# Patient Record
Sex: Female | Born: 1965 | Race: White | Hispanic: No | Marital: Single | Smoking: Current every day smoker
Health system: Southern US, Community
[De-identification: ages and names within clinical notes are randomized; demographics above are authoritative.]

## PROBLEM LIST (undated history)

## (undated) DIAGNOSIS — M549 Dorsalgia, unspecified: Secondary | ICD-10-CM

## (undated) DIAGNOSIS — G8929 Other chronic pain: Secondary | ICD-10-CM

## (undated) DIAGNOSIS — K029 Dental caries, unspecified: Secondary | ICD-10-CM

## (undated) HISTORY — PX: BACK SURGERY: SHX140

## (undated) HISTORY — PX: CHOLECYSTECTOMY: SHX55

---

## 2010-06-07 ENCOUNTER — Emergency Department: Payer: Self-pay | Admitting: Emergency Medicine

## 2012-02-22 ENCOUNTER — Emergency Department: Payer: Self-pay | Admitting: Emergency Medicine

## 2012-03-12 ENCOUNTER — Emergency Department (HOSPITAL_COMMUNITY)
Admission: EM | Admit: 2012-03-12 | Discharge: 2012-03-12 | Disposition: A | Discharge status: Home / Self Care | Payer: Self-pay | Attending: Emergency Medicine | Admitting: Emergency Medicine

## 2012-03-12 ENCOUNTER — Encounter (HOSPITAL_COMMUNITY): Payer: Self-pay | Admitting: Emergency Medicine

## 2012-03-12 DIAGNOSIS — Y998 Other external cause status: Secondary | ICD-10-CM | POA: Insufficient documentation

## 2012-03-12 DIAGNOSIS — K0889 Other specified disorders of teeth and supporting structures: Secondary | ICD-10-CM

## 2012-03-12 DIAGNOSIS — X58XXXA Exposure to other specified factors, initial encounter: Secondary | ICD-10-CM | POA: Insufficient documentation

## 2012-03-12 DIAGNOSIS — F17200 Nicotine dependence, unspecified, uncomplicated: Secondary | ICD-10-CM | POA: Insufficient documentation

## 2012-03-12 DIAGNOSIS — Y9389 Activity, other specified: Secondary | ICD-10-CM | POA: Insufficient documentation

## 2012-03-12 DIAGNOSIS — K029 Dental caries, unspecified: Secondary | ICD-10-CM | POA: Insufficient documentation

## 2012-03-12 DIAGNOSIS — S025XXA Fracture of tooth (traumatic), initial encounter for closed fracture: Secondary | ICD-10-CM | POA: Insufficient documentation

## 2012-03-12 MED ORDER — HYDROCODONE-ACETAMINOPHEN 5-325 MG PO TABS: 1.0000 | ORAL_TABLET | ORAL | Status: AC | PRN

## 2012-03-12 NOTE — ED Notes (Signed)
Patient presents with right upper molar tooth pain x several days; patient reports tooth broke off today while patient was eating lunch.  Patient has several cracked and what appears to be infected teeth throughout mouth.  Patient resting quietly, no distress noted.

## 2012-03-12 NOTE — ED Provider Notes (Signed)
History     CSN: [id number]  Arrival date & time 03/12/12  1124   First MD Initiated Contact with Patient 03/12/12 1253      Chief Complaint  Patient presents with  . Dental Pain    (Consider location/radiation/quality/duration/timing/severity/associated sxs/prior treatment) Patient is a 46 y.o. female presenting with tooth pain. The history is provided by the patient.  Dental PainThe primary symptoms include mouth pain and dental injury (A tooth  broke while she was eating). The symptoms began 2 to 6 hours ago. The symptoms are unchanged. The symptoms occur constantly.  Additional symptoms include: dental sensitivity to temperature and ear pain. Additional symptoms do not include: gum swelling, purulent gums, trismus, jaw pain, trouble swallowing and drooling.   She was seen at a dental to clinic 2 weeks ago, had dental x-rays, and was scheduled to return for dental extractions. She tried to both at home today for the pain, but it did not help.  History reviewed. No pertinent past medical history.  Past Surgical History  Procedure Date  . Cholecystectomy     No family history on file.  History  Substance Use Topics  . Smoking status: Current Everyday Smoker  . Smokeless tobacco: Not on file  . Alcohol Use: Yes    OB History    Grav Para Term Preterm Abortions TAB SAB Ect Mult Living                  Review of Systems  HENT: Positive for ear pain. Negative for drooling and trouble swallowing.   All other systems reviewed and are negative.    Allergies  Review of patient's allergies indicates no known allergies.  Home Medications   Current Outpatient Rx  Name Route Sig Dispense Refill  . IBUPROFEN 200 MG PO TABS Oral Take 200 mg by mouth every 6 (six) hours as needed. For pain    . HYDROCODONE-ACETAMINOPHEN 5-325 MG PO TABS Oral Take 1 tablet by mouth every 4 (four) hours as needed for pain. 10 tablet 0    BP 139/47  Pulse 80  Temp(Src) 98.1 F (36.7 C)  (Oral)  Resp 12  SpO2 99%  Physical Exam  Constitutional: She is oriented to person, place, and time. She appears well-developed and well-nourished. She appears distressed (nild).  HENT:  Head: Normocephalic.       Very poor dentition. Right upper premolar has a large carie and it is fractured.  No dence  fr gum swelling. No trismus.  Eyes: Conjunctivae and EOM are normal. Pupils are equal, round, and reactive to light.  Pulmonary/Chest: Effort normal.  Neurological: She is alert and oriented to person, place, and time. No cranial nerve deficit. She exhibits normal muscle tone. Coordination normal.  Skin: Skin is warm and dry. No rash noted.  Psychiatric: Her behavior is normal. Judgment and thought content normal.    ED Course  Procedures (including critical care time)  Labs Reviewed - No data to display No results found.   1. Pain, dental       MDM  Chronic poor dentition with fracture of a nonsalvageable tooth. No acute infection is suspected. She can be treated for pain and followup with her dental clinic as scheduled  Plan: Home Medications- Norco; Home Treatments- Ibuprofen; Recommended follow up- Dental as scheduled      Flint Melter, MD 03/12/12 1306

## 2012-03-12 NOTE — Discharge Instructions (Signed)
Followup at [facility] dental clinic as soon as possible. Take ibuprofen 3 times a day.   Dental Pain A tooth ache may be caused by cavities (tooth decay). Cavities expose the nerve of the tooth to air and hot or cold temperatures. It may come from an infection or abscess (also called a boil or furuncle) around your tooth. It is also often caused by dental caries (tooth decay). This causes the pain you are having. DIAGNOSIS  Your caregiver can diagnose this problem by exam. TREATMENT   If caused by an infection, it may be treated with medications which kill germs (antibiotics) and pain medications as prescribed by your caregiver. Take medications as directed.   Only take over-the-counter or prescription medicines for pain, discomfort, or fever as directed by your caregiver.   Whether the tooth ache today is caused by infection or dental disease, you should see your dentist as soon as possible for further care.  SEEK MEDICAL CARE IF: The exam and treatment you received today has been provided on an emergency basis only. This is not a substitute for complete medical or dental care. If your problem worsens or new problems (symptoms) appear, and you are unable to meet with your dentist, call or return to this location. SEEK IMMEDIATE MEDICAL CARE IF:   You have a fever.   You develop redness and swelling of your face, jaw, or neck.   You are unable to open your mouth.   You have severe pain uncontrolled by pain medicine.  MAKE SURE YOU:   Understand these instructions.   Will watch your condition.   Will get help right away if you are not doing well or get worse.  Document Released: 12/15/2005 Document Revised: 12/04/2011 Document Reviewed: 08/02/2008 [facility] Patient Information 2012 [place], [place].

## 2012-03-12 NOTE — ED Notes (Signed)
upper rt tooth pain after eating today

## 2012-03-17 ENCOUNTER — Emergency Department: Payer: Self-pay | Admitting: Emergency Medicine

## 2012-03-17 LAB — COMPREHENSIVE METABOLIC PANEL
Albumin: 3.5 g/dL (ref 3.4–5.0)
Alkaline Phosphatase: 128 U/L (ref 50–136)
Anion Gap: 12 (ref 7–16)
Bilirubin,Total: 0.3 mg/dL (ref 0.2–1.0)
Calcium, Total: 8.9 mg/dL (ref 8.5–10.1)
Co2: 26 mmol/L (ref 21–32)
EGFR (Non-African Amer.): >60
Osmolality: 281 (ref 275–301)
Potassium: 4 mmol/L (ref 3.5–5.1)
SGOT(AST): 31 U/L (ref 15–37)
Sodium: 141 mmol/L (ref 136–145)

## 2012-03-17 LAB — CBC
HCT: 39.5 % (ref 35.0–47.0)
HGB: 13.4 g/dL (ref 12.0–16.0)
MCH: 28.2 pg (ref 26.0–34.0)
MCHC: 33.9 g/dL (ref 32.0–36.0)
RBC: 4.75 10*6/uL (ref 3.80–5.20)
WBC: 7.6 10*3/uL (ref 3.6–11.0)

## 2012-03-17 LAB — URINALYSIS, COMPLETE
Ketone: NEGATIVE
Ph: 5 (ref 4.5–8.0)
Protein: 30
RBC,UR: 2972 /HPF (ref 0–5)
Specific Gravity: 1.02 (ref 1.003–1.030)

## 2012-03-17 LAB — LIPASE, BLOOD: Lipase: 69 U/L — ABNORMAL LOW (ref 73–393)

## 2012-03-25 ENCOUNTER — Emergency Department (HOSPITAL_COMMUNITY): Payer: Self-pay

## 2012-03-25 ENCOUNTER — Emergency Department (HOSPITAL_COMMUNITY)
Admission: EM | Admit: 2012-03-25 | Discharge: 2012-03-25 | Disposition: A | Discharge status: Home / Self Care | Payer: Self-pay | Attending: Emergency Medicine | Admitting: Emergency Medicine

## 2012-03-25 ENCOUNTER — Encounter (HOSPITAL_COMMUNITY): Payer: Self-pay | Admitting: *Deleted

## 2012-03-25 DIAGNOSIS — S300XXA Contusion of lower back and pelvis, initial encounter: Secondary | ICD-10-CM | POA: Insufficient documentation

## 2012-03-25 DIAGNOSIS — W108XXA Fall (on) (from) other stairs and steps, initial encounter: Secondary | ICD-10-CM | POA: Insufficient documentation

## 2012-03-25 MED ORDER — OXYCODONE-ACETAMINOPHEN 5-325 MG PO TABS
2.0000 | ORAL_TABLET | Freq: Once | ORAL | Status: AC
  Administered 2012-03-25: 2 via ORAL
  Filled 2012-03-25: qty 2

## 2012-03-25 MED ORDER — OXYCODONE-ACETAMINOPHEN 5-325 MG PO TABS: 1.0000 | ORAL_TABLET | Freq: Four times a day (QID) | ORAL | Status: AC | PRN

## 2012-03-25 NOTE — ED Notes (Signed)
Pt slid down stairs and landed on tailbone.  Pt denies hitting head and denies LOC.  Vital within normal limits and Pt alert oriented X4

## 2012-03-25 NOTE — ED Notes (Signed)
Pt reports she fell down the stairs this am landing on her tailbone. Denies hitting head. Ambulatory without difficulty.

## 2012-03-25 NOTE — Discharge Instructions (Signed)

## 2012-03-25 NOTE — ED Provider Notes (Signed)
History     CSN: [id number]  Arrival date & time 03/25/12  0825   First MD Initiated Contact with Patient 03/25/12 [id number]      Chief Complaint  Patient presents with  . Fall  . Tailbone Pain    (Consider location/radiation/quality/duration/timing/severity/associated sxs/prior treatment) Patient is a 46 y.o. female presenting with fall. The history is provided by the patient.  Fall The accident occurred less than 1 hour ago. The fall occurred while walking (slipped on steps, landed on tailbone.  ). Impact surface: steps. There was no blood loss. Point of impact: buttocks. The pain is severe.    History reviewed. No pertinent past medical history.  Past Surgical History  Procedure Date  . Cholecystectomy   . Back surgery     History reviewed. No pertinent family history.  History  Substance Use Topics  . Smoking status: Current Everyday Smoker  . Smokeless tobacco: Not on file  . Alcohol Use: Yes    OB History    Grav Para Term Preterm Abortions TAB SAB Ect Mult Living                  Review of Systems  All other systems reviewed and are negative.    Allergies  Review of patient's allergies indicates no known allergies.  Home Medications   Current Outpatient Rx  Name Route Sig Dispense Refill  . IBUPROFEN 200 MG PO TABS Oral Take 400-600 mg by mouth every 6 (six) hours as needed. For pain      BP 133/77  Pulse 75  Temp(Src) 98 F (36.7 C) (Oral)  Resp 20  Ht 5\' 4"  (1.626 m)  Wt 210 lb (95.255 kg)  BMI 36.05 kg/m2  SpO2 97%  Physical Exam  Nursing note and vitals reviewed. Constitutional: She is oriented to person, place, and time. She appears well-developed and well-nourished.       Appears very uncomfortable.  Neck: Normal range of motion. Neck supple.  Musculoskeletal:       There is ttp over the coccyx.    Neurological: She is alert and oriented to person, place, and time.       She is ambulatory without difficulty.  No weakness of the  lower extremities.  Skin: Skin is warm and dry.    ED Course  Procedures (including critical care time)  Labs Reviewed - No data to display No results found.   No diagnosis found.    MDM  The xrays look okay.  Patient will be given pain meds, to follow up prn if not improving.          Geoffery Lyons, MD 03/25/12 1000

## 2012-04-04 ENCOUNTER — Emergency Department: Payer: Self-pay | Admitting: Internal Medicine

## 2012-04-07 ENCOUNTER — Emergency Department: Payer: Self-pay | Admitting: Emergency Medicine

## 2012-04-07 LAB — COMPREHENSIVE METABOLIC PANEL
Anion Gap: 9 (ref 7–16)
Creatinine: 0.61 mg/dL (ref 0.60–1.30)
Osmolality: 277 (ref 275–301)
SGOT(AST): 24 U/L (ref 15–37)
SGPT (ALT): 32 U/L
Sodium: 138 mmol/L (ref 136–145)
Total Protein: 8.4 g/dL — ABNORMAL HIGH (ref 6.4–8.2)

## 2012-04-07 LAB — CBC
HCT: 39.9 % (ref 35.0–47.0)
HGB: 13.5 g/dL (ref 12.0–16.0)
MCH: 28 pg (ref 26.0–34.0)
MCHC: 33.8 g/dL (ref 32.0–36.0)
RBC: 4.82 10*6/uL (ref 3.80–5.20)
RDW: 15.4 % — ABNORMAL HIGH (ref 11.5–14.5)

## 2012-04-07 LAB — URINALYSIS, COMPLETE
Bilirubin,UR: NEGATIVE
Blood: NEGATIVE
Ketone: NEGATIVE
Leukocyte Esterase: NEGATIVE
Nitrite: NEGATIVE
Ph: 6 (ref 4.5–8.0)
Protein: NEGATIVE
Squamous Epithelial: 11
WBC UR: 4 /HPF (ref 0–5)

## 2012-04-07 LAB — LIPASE, BLOOD: Lipase: 108 U/L (ref 73–393)

## 2012-05-03 ENCOUNTER — Encounter (HOSPITAL_COMMUNITY): Payer: Self-pay | Admitting: Emergency Medicine

## 2012-05-03 ENCOUNTER — Emergency Department (HOSPITAL_COMMUNITY)
Admission: EM | Admit: 2012-05-03 | Discharge: 2012-05-03 | Disposition: A | Discharge status: Home / Self Care | Payer: Self-pay | Attending: Emergency Medicine | Admitting: Emergency Medicine

## 2012-05-03 DIAGNOSIS — K029 Dental caries, unspecified: Secondary | ICD-10-CM | POA: Insufficient documentation

## 2012-05-03 DIAGNOSIS — K089 Disorder of teeth and supporting structures, unspecified: Secondary | ICD-10-CM | POA: Insufficient documentation

## 2012-05-03 MED ORDER — OXYCODONE-ACETAMINOPHEN 5-325 MG PO TABS: 1.0000 | ORAL_TABLET | Freq: Four times a day (QID) | ORAL | Status: DC | PRN

## 2012-05-03 MED ORDER — PENICILLIN V POTASSIUM 250 MG PO TABS: 250.0000 mg | ORAL_TABLET | Freq: Four times a day (QID) | ORAL | Status: AC

## 2012-05-03 NOTE — ED Notes (Signed)
Pt c/o left bottom tooth pain starting this am; pt sts piece of tooth broke off today; pt sts seen at [facility] and to go back in 2 weeks to dental clinic

## 2012-05-03 NOTE — ED Provider Notes (Addendum)
This chart was scribed for Gwyneth Sprout, MD by Williemae Natter. The patient was seen in room STRE8/STRE8 at 12:39 PM.  History     CSN: [id number]  Arrival date & time 05/03/12  1153   First MD Initiated Contact with Patient 05/03/12 1233      Chief Complaint  Patient presents with  . Dental Pain    (Consider location/radiation/quality/duration/timing/severity/associated sxs/prior treatment) HPI Cathy Thompson is a 46 y.o. female who presents to the Emergency Department complaining of dental pain. Pt reports having hx of poor dentition. Pt has pain in tooth since this morning when a piece of her tooth broke off. Pt was seen at [facility] and is scheduled to go back in 2 weeks. Treating pain with Ibuprofen with no relief History reviewed. No pertinent past medical history.  Past Surgical History  Procedure Date  . Cholecystectomy   . Back surgery     History reviewed. No pertinent family history.  History  Substance Use Topics  . Smoking status: Current Everyday Smoker  . Smokeless tobacco: Not on file  . Alcohol Use: Yes    OB History    Grav Para Term Preterm Abortions TAB SAB Ect Mult Living                  Review of Systems  Constitutional: Negative for fever and chills.  HENT: Positive for dental problem.   Respiratory: Negative for shortness of breath.   Gastrointestinal: Negative for nausea and vomiting.  Neurological: Negative for weakness.  All other systems reviewed and are negative.    Allergies  Review of patient's allergies indicates no known allergies.  Home Medications   Current Outpatient Rx  Name Route Sig Dispense Refill  . IBUPROFEN 200 MG PO TABS Oral Take 400-600 mg by mouth every 6 (six) hours as needed. For pain      BP 160/122  Pulse 90  Temp(Src) 98.4 F (36.9 C) (Oral)  Resp 18  SpO2 98%  Physical Exam  Nursing note and vitals reviewed. Constitutional: She is oriented to person, place, and time. She appears well-developed and  well-nourished. No distress.  HENT:  Head: Normocephalic and atraumatic.  Mouth/Throat: Abnormal dentition. Dental caries present.       Multiple dental caries Decayed and broken off 1st lower molar on left side  Eyes: EOM are normal.  Neck: Neck supple. No tracheal deviation present.  Cardiovascular: Normal rate.   Pulmonary/Chest: Effort normal and breath sounds normal. No respiratory distress.  Musculoskeletal: Normal range of motion.  Neurological: She is alert and oriented to person, place, and time.  Skin: Skin is warm and dry.  Psychiatric: She has a normal mood and affect. Her behavior is normal.    ED Course  Dental Date/Time: 05/03/2012 12:51 PM Performed by: Gwyneth Sprout Authorized by: Gwyneth Sprout Consent: Verbal consent obtained. Consent given by: patient Local anesthesia used: yes Anesthesia: local infiltration (apical dental block) Local anesthetic: bupivacaine 0.5% without epinephrine Anesthetic total: 2 ml Patient sedated: no Patient tolerance: Patient tolerated the procedure well with no immediate complications. Comments: Improvement in pain   (including critical care time)  Labs Reviewed - No data to display No results found.   1. Dental caries       MDM   Pt with multiple dental caries and facial swelling.  No signs of ludwig's angina or difficulty swallowing and no systemic symptoms.  Improvement after dental block Will treat with PCN and have pt f/u with dentist.   I  personally performed the services described in this documentation, which was scribed in my presence.  The recorded information has been reviewed and considered.        Gwyneth Sprout, MD 05/03/12 1252  Gwyneth Sprout, MD 05/03/12 1253

## 2012-05-03 NOTE — Discharge Instructions (Signed)
Dental Pain  A tooth ache may be caused by cavities (tooth decay). Cavities expose the nerve of the tooth to air and hot or cold temperatures. It may come from an infection or abscess (also called a boil or furuncle) around your tooth. It is also often caused by dental caries (tooth decay). This causes the pain you are having.  DIAGNOSIS   Your caregiver can diagnose this problem by exam.  TREATMENT   · If caused by an infection, it may be treated with medications which kill germs (antibiotics) and pain medications as prescribed by your caregiver. Take medications as directed.  · Only take over-the-counter or prescription medicines for pain, discomfort, or fever as directed by your caregiver.  · Whether the tooth ache today is caused by infection or dental disease, you should see your dentist as soon as possible for further care.  SEEK MEDICAL CARE IF:  The exam and treatment you received today has been provided on an emergency basis only. This is not a substitute for complete medical or dental care. If your problem worsens or new problems (symptoms) appear, and you are unable to meet with your dentist, call or return to this location.  SEEK IMMEDIATE MEDICAL CARE IF:   · You have a fever.  · You develop redness and swelling of your face, jaw, or neck.  · You are unable to open your mouth.  · You have severe pain uncontrolled by pain medicine.  MAKE SURE YOU:   · Understand these instructions.  · Will watch your condition.  · Will get help right away if you are not doing well or get worse.  Document Released: 12/15/2005 Document Revised: 12/04/2011 Document Reviewed: 08/02/2008  [employer]® Patient Information ©2012 [employer].

## 2012-05-13 ENCOUNTER — Encounter (HOSPITAL_COMMUNITY): Payer: Self-pay

## 2012-05-13 ENCOUNTER — Emergency Department (HOSPITAL_COMMUNITY)
Admission: EM | Admit: 2012-05-13 | Discharge: 2012-05-13 | Disposition: A | Discharge status: Home / Self Care | Payer: Self-pay | Attending: Emergency Medicine | Admitting: Emergency Medicine

## 2012-05-13 DIAGNOSIS — K0381 Cracked tooth: Secondary | ICD-10-CM | POA: Insufficient documentation

## 2012-05-13 DIAGNOSIS — K0889 Other specified disorders of teeth and supporting structures: Secondary | ICD-10-CM

## 2012-05-13 DIAGNOSIS — K089 Disorder of teeth and supporting structures, unspecified: Secondary | ICD-10-CM | POA: Insufficient documentation

## 2012-05-13 DIAGNOSIS — F17200 Nicotine dependence, unspecified, uncomplicated: Secondary | ICD-10-CM | POA: Insufficient documentation

## 2012-05-13 MED ORDER — OXYCODONE-ACETAMINOPHEN 5-325 MG PO TABS: 2.0000 | ORAL_TABLET | ORAL | Status: DC | PRN

## 2012-05-13 MED ORDER — PENICILLIN V POTASSIUM 500 MG PO TABS: 500.0000 mg | ORAL_TABLET | Freq: Four times a day (QID) | ORAL | Status: AC

## 2012-05-13 NOTE — ED Notes (Signed)
Pt states "i have bad teeth.  My one tooth i had in the back right upper broke off two days ago while i was eating and i've been in soo much pain ever since."  Pt has poor dentition and has obvious carries and discoloration to teeth.  Pt talking very fast; anxious about teeth.  Pt continues to report "i am trying to get into [facility] to get my teeth fixed--i dont have insurance so i cant afford to go to the dentist but they're on a lottery system and they told me to keep trying and eventually they'll be able to fix my teeth."

## 2012-05-13 NOTE — ED Notes (Signed)
Pt presents with R upper tooth pain after it broke eating Romaine lettuce x 2 days ago.

## 2012-05-13 NOTE — ED Provider Notes (Signed)
History   This chart was scribed for Glynn Octave, MD by Charolett Bumpers . The patient was seen in room STRE3/STRE3.    CSN: [id number]  Arrival date & time 05/13/12  1213   First MD Initiated Contact with Patient 05/13/12 1238      Chief Complaint  Patient presents with  . Dental Pain    (Consider location/radiation/quality/duration/timing/severity/associated sxs/prior treatment) HPI Cathy Thompson is a 46 y.o. female who presents to the Emergency Department complaining of constant, moderate right upper dental pain for the past 2 days. Patient denies any fever or vomiting. Patient states that the tooth broke after eating romaine lettuce. Patient states that she has taken Naproxen and Tylenol with no relief. No associated symptoms reported. No pertinent medical or surgical hx reported.   History reviewed. No pertinent past medical history.  Past Surgical History  Procedure Date  . Cholecystectomy   . Back surgery     No family history on file.  History  Substance Use Topics  . Smoking status: Current Everyday Smoker -- 0.5 packs/day  . Smokeless tobacco: Not on file  . Alcohol Use: Yes    OB History    Grav Para Term Preterm Abortions TAB SAB Ect Mult Living                  Review of Systems  Constitutional: Negative for fever.  HENT: Positive for dental problem.   Gastrointestinal: Negative for vomiting.  All other systems reviewed and are negative.    Allergies  Review of patient's allergies indicates no known allergies.  Home Medications   Current Outpatient Rx  Name Route Sig Dispense Refill  . IBUPROFEN 200 MG PO TABS Oral Take 400-600 mg by mouth every 6 (six) hours as needed. For pain    . NAPROXEN SODIUM 220 MG PO TABS Oral Take 220 mg by mouth 2 (two) times daily as needed.      BP 136/89  Pulse 96  Temp(Src) 98.3 F (36.8 C) (Oral)  Resp 20  Ht 5\' 4"  (1.626 m)  Wt 210 lb (95.255 kg)  BMI 36.05 kg/m2  SpO2 97%  LMP  05/13/2012  Physical Exam  Nursing note and vitals reviewed. Constitutional: She is oriented to person, place, and time. She appears well-developed and well-nourished. No distress.  HENT:  Head: Normocephalic and atraumatic. No trismus in the jaw.  Mouth/Throat: No dental abscesses.       Right premolar cracked without evidence of abscess. Floor of mouth soft. No trismus. Poor dentition throughout.   Eyes: EOM are normal.  Neck: Neck supple. No tracheal deviation present.  Cardiovascular: Normal rate.   Pulmonary/Chest: Effort normal. No respiratory distress.  Musculoskeletal: Normal range of motion.  Neurological: She is alert and oriented to person, place, and time.  Skin: Skin is warm and dry.  Psychiatric: She has a normal mood and affect. Her behavior is normal.    ED Course  Procedures (including critical care time)  DIAGNOSTIC STUDIES: Oxygen Saturation is 97% on room air, normal by my interpretation.    COORDINATION OF CARE:  1256: Discussed planned course of treatment with the patient, who is agreeable at this time. Discussed f/u with dentist on call.     Labs Reviewed - No data to display No results found.   No diagnosis found.    MDM  Dental pain without evidence of abscess. Floor of mouth soft. No trismus. No evidence of ludwig's angina Multiple fractured teeth.  Dental followup discussed.  I personally performed the services described in this documentation, which was scribed in my presence.  The recorded information has been reviewed and considered.       Glynn Octave, MD 05/13/12 1308

## 2012-05-13 NOTE — Discharge Instructions (Signed)
Dental Pain  A tooth ache may be caused by cavities (tooth decay). Cavities expose the nerve of the tooth to air and hot or cold temperatures. It may come from an infection or abscess (also called a boil or furuncle) around your tooth. It is also often caused by dental caries (tooth decay). This causes the pain you are having.  DIAGNOSIS   Your caregiver can diagnose this problem by exam.  TREATMENT   · If caused by an infection, it may be treated with medications which kill germs (antibiotics) and pain medications as prescribed by your caregiver. Take medications as directed.  · Only take over-the-counter or prescription medicines for pain, discomfort, or fever as directed by your caregiver.  · Whether the tooth ache today is caused by infection or dental disease, you should see your dentist as soon as possible for further care.  SEEK MEDICAL CARE IF:  The exam and treatment you received today has been provided on an emergency basis only. This is not a substitute for complete medical or dental care. If your problem worsens or new problems (symptoms) appear, and you are unable to meet with your dentist, call or return to this location.  SEEK IMMEDIATE MEDICAL CARE IF:   · You have a fever.  · You develop redness and swelling of your face, jaw, or neck.  · You are unable to open your mouth.  · You have severe pain uncontrolled by pain medicine.  MAKE SURE YOU:   · Understand these instructions.  · Will watch your condition.  · Will get help right away if you are not doing well or get worse.  Document Released: 12/15/2005 Document Revised: 12/04/2011 Document Reviewed: 08/02/2008  [employer]® Patient Information ©2012 [employer].

## 2012-05-19 ENCOUNTER — Encounter (HOSPITAL_COMMUNITY): Payer: Self-pay | Admitting: *Deleted

## 2012-05-19 ENCOUNTER — Emergency Department (HOSPITAL_COMMUNITY)
Admission: EM | Admit: 2012-05-19 | Discharge: 2012-05-19 | Disposition: A | Discharge status: Home / Self Care | Payer: Self-pay | Attending: Emergency Medicine | Admitting: Emergency Medicine

## 2012-05-19 DIAGNOSIS — K0889 Other specified disorders of teeth and supporting structures: Secondary | ICD-10-CM

## 2012-05-19 DIAGNOSIS — K089 Disorder of teeth and supporting structures, unspecified: Secondary | ICD-10-CM | POA: Insufficient documentation

## 2012-05-19 MED ORDER — OXYCODONE-ACETAMINOPHEN 5-325 MG PO TABS
2.0000 | ORAL_TABLET | Freq: Once | ORAL | Status: AC
  Administered 2012-05-19: 2 via ORAL
  Filled 2012-05-19: qty 2

## 2012-05-19 MED ORDER — OXYCODONE-ACETAMINOPHEN 5-325 MG PO TABS: ORAL_TABLET | ORAL | Status: DC

## 2012-05-19 MED ORDER — PENICILLIN V POTASSIUM 500 MG PO TABS: 500.0000 mg | ORAL_TABLET | Freq: Four times a day (QID) | ORAL | Status: AC

## 2012-05-19 NOTE — ED Notes (Signed)
To ED for eval of left lower tooth pain. Pt has been to [facility] dental [facility] without treatment. Unable to go to dentist until insurance starts in 60 days.

## 2012-05-19 NOTE — ED Provider Notes (Addendum)
History    This chart was scribed for Cathy Thompson. Cathy Lamas, MD, MD by Smitty Pluck. The patient was seen in room STRE7 and the patient's care was started at 1:50PM.   CSN: [id number]  Arrival date & time 05/19/12  1328   First MD Initiated Contact with Patient 05/19/12 1346      Chief Complaint  Patient presents with  . Dental Pain    (Consider location/radiation/quality/duration/timing/severity/associated sxs/prior treatment) Patient is a 46 y.o. female presenting with tooth pain. The history is provided by the patient.  Dental PainPrimary symptoms do not include fever, shortness of breath or sore throat.  Additional symptoms do not include: facial swelling and trouble swallowing.   Cathy Thompson is a 46 y.o. female who presents to the Emergency Department complaining of moderate left lower tooth pain onset 2 weeks ago. Symptoms have been constant since onset without radiation.  Pt reports that she has been to [facility] dental [facility] without treatment. Denies fever, chills and vomiting.   History reviewed. No pertinent past medical history.  Past Surgical History  Procedure Date  . Cholecystectomy   . Back surgery     History reviewed. No pertinent family history.  History  Substance Use Topics  . Smoking status: Current Everyday Smoker -- 0.5 packs/day  . Smokeless tobacco: Not on file  . Alcohol Use: Yes    OB History    Grav Para Term Preterm Abortions TAB SAB Ect Mult Living                  Review of Systems  Constitutional: Negative for fever and chills.  HENT: Positive for dental problem. Negative for sore throat, facial swelling and trouble swallowing.   Respiratory: Negative for shortness of breath.   Gastrointestinal: Negative for nausea and vomiting.    Allergies  Review of patient's allergies indicates no known allergies.  Home Medications   Current Outpatient Rx  Name Route Sig Dispense Refill  . IBUPROFEN 200 MG PO TABS Oral Take 400-600 mg by mouth  every 6 (six) hours as needed. For pain    . NAPROXEN SODIUM 220 MG PO TABS Oral Take 220 mg by mouth 2 (two) times daily as needed. For pain    . PENICILLIN V POTASSIUM 500 MG PO TABS Oral Take 1 tablet (500 mg total) by mouth 4 (four) times daily. 40 tablet 0  . OXYCODONE-ACETAMINOPHEN 5-325 MG PO TABS  1-2 tablets po q 6 hours prn severe pain 20 tablet 0  . PENICILLIN V POTASSIUM 500 MG PO TABS Oral Take 1 tablet (500 mg total) by mouth 4 (four) times daily. 40 tablet 0    BP 157/118  Pulse 98  Temp(Src) 97.9 F (36.6 C) (Oral)  Resp 18  SpO2 95%  LMP 05/13/2012  Physical Exam  Nursing note and vitals reviewed. Constitutional: She is oriented to person, place, and time. She appears well-developed and well-nourished. No distress.  HENT:  Head: Normocephalic and atraumatic.  Mouth/Throat: Uvula is midline and mucous membranes are normal.       General decay started in 1st premolar and continuing throughout molars with second molar is worst on bottom left.  Third molar is not present. No fluctuant abscess    Eyes: Conjunctivae are normal. Pupils are equal, round, and reactive to light.  Neck: Normal range of motion. Neck supple.  Pulmonary/Chest: Effort normal. No respiratory distress.  Abdominal: Soft. She exhibits no distension.  Neurological: She is alert and oriented to person, place, and  time.  Skin: Skin is warm and dry.  Psychiatric: She has a normal mood and affect. Her behavior is normal.    ED Course  Procedures (including critical care time) DIAGNOSTIC STUDIES: Oxygen Saturation is 95% on room air, normal by my interpretation.    COORDINATION OF CARE: 1:54PM EDP discusses pt ED treatment with pt   Labs Reviewed - No data to display No results found.   1. Pain, dental       MDM  I personally performed the services described in this documentation, which was scribed in my presence. The recorded information has been reviewed and considered.    Pt with  dentalgia, possibly early abscess, pt has no insurance, referred to Dr. Oswaldo Done and told to inform staff that she is an ED referral.  She will do so shortly.  Rx for PCN and 20 percocet given.          Cathy Thompson. Cathy Lamas, MD 05/19/12 1415  Cathy Thompson. Lindy Pennisi, MD 05/19/12 1416

## 2012-05-19 NOTE — Discharge Instructions (Signed)
Dental Pain A tooth ache may be caused by cavities (tooth decay). Cavities expose the nerve of the tooth to air and hot or cold temperatures. It may come from an infection or abscess (also called a boil or furuncle) around your tooth. It is also often caused by dental caries (tooth decay). This causes the pain you are having. DIAGNOSIS  Your caregiver can diagnose this problem by exam. TREATMENT   If caused by an infection, it may be treated with medications which kill germs (antibiotics) and pain medications as prescribed by your caregiver. Take medications as directed.   Only take over-the-counter or prescription medicines for pain, discomfort, or fever as directed by your caregiver.   Whether the tooth ache today is caused by infection or dental disease, you should see your dentist as soon as possible for further care.  SEEK MEDICAL CARE IF: The exam and treatment you received today has been provided on an emergency basis only. This is not a substitute for complete medical or dental care. If your problem worsens or new problems (symptoms) appear, and you are unable to meet with your dentist, call or return to this location. SEEK IMMEDIATE MEDICAL CARE IF:   You have a fever.   You develop redness and swelling of your face, jaw, or neck.   You are unable to open your mouth.   You have severe pain uncontrolled by pain medicine.  MAKE SURE YOU:   Understand these instructions.   Will watch your condition.   Will get help right away if you are not doing well or get worse.  Document Released: 12/15/2005 Document Revised: 12/04/2011 Document Reviewed: 08/02/2008 [employer] Patient Information 2012 [employer].    Narcotic and benzodiazepine use may cause drowsiness, slowed breathing or dependence.  Please use with caution and do not drive, operate machinery or watch young children alone while taking them.  Taking combinations of these medications or drinking alcohol will potentiate  these effects.    

## 2012-06-03 ENCOUNTER — Emergency Department (HOSPITAL_COMMUNITY)
Admission: EM | Admit: 2012-06-03 | Discharge: 2012-06-03 | Disposition: A | Discharge status: Home / Self Care | Payer: Self-pay | Attending: Emergency Medicine | Admitting: Emergency Medicine

## 2012-06-03 ENCOUNTER — Encounter (HOSPITAL_COMMUNITY): Payer: Self-pay | Admitting: *Deleted

## 2012-06-03 DIAGNOSIS — X58XXXA Exposure to other specified factors, initial encounter: Secondary | ICD-10-CM | POA: Insufficient documentation

## 2012-06-03 DIAGNOSIS — S025XXA Fracture of tooth (traumatic), initial encounter for closed fracture: Secondary | ICD-10-CM | POA: Insufficient documentation

## 2012-06-03 DIAGNOSIS — K029 Dental caries, unspecified: Secondary | ICD-10-CM

## 2012-06-03 DIAGNOSIS — F17200 Nicotine dependence, unspecified, uncomplicated: Secondary | ICD-10-CM | POA: Insufficient documentation

## 2012-06-03 MED ORDER — TRAMADOL HCL 50 MG PO TABS
50.0000 mg | ORAL_TABLET | Freq: Once | ORAL | Status: AC
  Administered 2012-06-03: 50 mg via ORAL
  Filled 2012-06-03: qty 1

## 2012-06-03 MED ORDER — TRAMADOL HCL 50 MG PO TABS: 50.0000 mg | ORAL_TABLET | Freq: Four times a day (QID) | ORAL | Status: AC | PRN

## 2012-06-03 NOTE — ED Provider Notes (Signed)
History     CSN: [id number]  Arrival date & time 06/03/12  1045   None     Chief Complaint  Patient presents with  . Dental Pain    (Consider location/radiation/quality/duration/timing/severity/associated sxs/prior treatment) Patient is a 46 y.o. female presenting with tooth pain. The history is provided by the patient.  Dental PainThe primary symptoms include mouth pain. Primary symptoms do not include oral lesions, fever or sore throat. The symptoms began more than 1 month ago. The symptoms are worsening. The symptoms occur constantly.  Additional symptoms do not include: purulent gums and facial swelling. Associated symptoms comments: 46 yo female INAD c/o lower left tooth pain exacerbation. Denies fever and chills or swelling to gum. Pt recently completed PCN course and has an appointment for tooth extraction in 3 weeks .    History reviewed. No pertinent past medical history.  Past Surgical History  Procedure Date  . Cholecystectomy   . Back surgery     No family history on file.  History  Substance Use Topics  . Smoking status: Current Everyday Smoker -- 0.5 packs/day  . Smokeless tobacco: Not on file  . Alcohol Use: No    OB History    Grav Para Term Preterm Abortions TAB SAB Ect Mult Living                  Review of Systems  Constitutional: Negative.  Negative for fever.  HENT: Positive for dental problem. Negative for sore throat, facial swelling, neck pain and neck stiffness.   Respiratory: Negative.   Cardiovascular: Negative.   Gastrointestinal: Negative.     Allergies  Review of patient's allergies indicates no known allergies.  Home Medications   Current Outpatient Rx  Name Route Sig Dispense Refill  . IBUPROFEN 200 MG PO TABS Oral Take 800 mg by mouth every 6 (six) hours as needed. For pain    . NAPROXEN SODIUM 220 MG PO TABS Oral Take 220 mg by mouth 2 (two) times daily as needed. For pain      BP 161/94  Pulse 84  Temp(Src) 97.7 F  (36.5 C) (Oral)  Resp 18  SpO2 95%  LMP 05/13/2012  Physical Exam  Constitutional: She appears well-developed and well-nourished. No distress.  HENT:  Head: Normocephalic.       Multiple dental and tooth fractures carries without abscess.  Eyes: Pupils are equal, round, and reactive to light.  Neck: Normal range of motion.    ED Course  Procedures (including critical care time)  Labs Reviewed - No data to display No results found.   No diagnosis found. Dental Caries    MDM  Pt with chronic dental issues. Denies fever and swelling. No evidence of abscess. Gave Rx for tramadol and instructed her to follow with dentist for definitive care         Rodena Medin, PA-C 06/03/12 1118

## 2012-06-03 NOTE — ED Notes (Signed)
Pt presents with multiple dental caries "for a while" to L lower side.  Pt reports she has been seen at [facility] clinic for same, reports she goes back in 3 weeks, reports pain has worsened.  Slight swelling noted to L jaw.

## 2012-06-03 NOTE — ED Notes (Signed)
Patient with complaints of tooth pain on the lower left side for 2 days.  She is to see the dental clinic in 3 weeks

## 2012-06-03 NOTE — Discharge Instructions (Signed)
Follow with your dentist  And continue to take motrin as needed. Take tramadol for breakthrough pain. Return for Fever or Swelling   Tooth Decay  Tooth decay happens when a tooth breaks down. This break down can cause a hole in the tooth that can get bigger and deeper over time. Treatment is needed right away. HOME CARE To prevent tooth decay:  Brush your teeth and floss every day.   Make and keep all regular check-ups and cleanings with your dentist.  GET HELP RIGHT AWAY IF:   You have a fever over 102 F (38.9 C).   You see redness and puffiness (swelling) of the face, jaw, or neck.   You cannot open your mouth.   You have severe tooth pain not helped by medicine.  Document Released: 09/23/2008 Document Revised: 12/04/2011 Document Reviewed: 09/23/2008 [facility] Patient Information 2012 [place], [place].

## 2012-06-03 NOTE — ED Provider Notes (Signed)
Medical screening examination/treatment/procedure(s) were performed by non-physician practitioner and as supervising physician I was immediately available for consultation/collaboration.   Itzae Mccurdy, MD 06/03/12 1614 

## 2012-06-15 ENCOUNTER — Emergency Department: Payer: Self-pay | Admitting: Internal Medicine

## 2012-07-02 ENCOUNTER — Encounter (HOSPITAL_COMMUNITY): Payer: Self-pay | Admitting: Physical Medicine and Rehabilitation

## 2012-07-02 ENCOUNTER — Emergency Department (HOSPITAL_COMMUNITY)
Admission: EM | Admit: 2012-07-02 | Discharge: 2012-07-02 | Disposition: A | Discharge status: Home / Self Care | Payer: Self-pay | Attending: Emergency Medicine | Admitting: Emergency Medicine

## 2012-07-02 DIAGNOSIS — Z9089 Acquired absence of other organs: Secondary | ICD-10-CM | POA: Insufficient documentation

## 2012-07-02 DIAGNOSIS — K029 Dental caries, unspecified: Secondary | ICD-10-CM | POA: Insufficient documentation

## 2012-07-02 DIAGNOSIS — G8929 Other chronic pain: Secondary | ICD-10-CM

## 2012-07-02 DIAGNOSIS — F17200 Nicotine dependence, unspecified, uncomplicated: Secondary | ICD-10-CM | POA: Insufficient documentation

## 2012-07-02 MED ORDER — PENICILLIN V POTASSIUM 500 MG PO TABS: 500.0000 mg | ORAL_TABLET | Freq: Four times a day (QID) | ORAL | Status: AC

## 2012-07-02 MED ORDER — OXYCODONE-ACETAMINOPHEN 5-325 MG PO TABS: 1.0000 | ORAL_TABLET | Freq: Four times a day (QID) | ORAL | Status: AC | PRN

## 2012-07-02 NOTE — ED Provider Notes (Signed)
History   This chart was scribed for Kamel Haven B. Bernette Mayers, MD by Toya Smothers. The patient was seen in room TR05C/TR05C. Patient's care was started at 1105.  CSN: [id number]  Arrival date & time 07/02/12  1105   First MD Initiated Contact with Patient 07/02/12 1214      Chief Complaint  Patient presents with  . Dental Pain   HPI  Cathy Thompson is a 46 y.o. female who presents to the Emergency Department complaining of intermittent moderate dental pain yesterday. Similar to numerous previous episodes. Pt has treated with tramadol, aleve, and topical treatment with mild to no relief. Pt is awaiting an appointment at [facility]. Pt lists a h/o cholecystectomy and back surgery.  No past medical history on file.  Past Surgical History  Procedure Date  . Cholecystectomy   . Back surgery     No family history on file.  History  Substance Use Topics  . Smoking status: Current Everyday Smoker -- 0.5 packs/day    Types: Cigarettes  . Smokeless tobacco: Not on file  . Alcohol Use: No   Review of Systems All other systems reviewed and are negative except as noted in HPI.   Allergies  Review of patient's allergies indicates no known allergies.  Home Medications   Current Outpatient Rx  Name Route Sig Dispense Refill  . NAPROXEN SODIUM 220 MG PO TABS Oral Take 220 mg by mouth 2 (two) times daily as needed. For pain      BP 155/76  Pulse 75  Temp 98.3 F (36.8 C) (Oral)  Resp 20  SpO2 98%  Physical Exam  Nursing note and vitals reviewed. Constitutional: She is oriented to person, place, and time. She appears well-developed and well-nourished.  HENT:  Head: Normocephalic and atraumatic.       Poor dentition, multiple large dental caries and decayed teeth, no abscess  Eyes: EOM are normal. Pupils are equal, round, and reactive to light.  Neck: Normal range of motion. Neck supple.  Cardiovascular: Normal rate, normal heart sounds and intact distal pulses.     Pulmonary/Chest: Effort normal and breath sounds normal.  Abdominal: Bowel sounds are normal. She exhibits no distension. There is no tenderness.  Musculoskeletal: Normal range of motion. She exhibits no edema and no tenderness.  Neurological: She is alert and oriented to person, place, and time. She has normal strength. No cranial nerve deficit or sensory deficit.  Skin: Skin is warm and dry. No rash noted.  Psychiatric: She has a normal mood and affect.    ED Course  Procedures (including critical care time) DIAGNOSTIC STUDIES: Oxygen Saturation is 98% on room air, normal by my interpretation.    COORDINATION OF CARE: 1222-Evaluated Pt. Discussed possible referrals for extraction.   Labs Reviewed - No data to display No results found.   No diagnosis found.    MDM  Dental pain due to dental caries. Advised followup with Dentist.      I personally performed the services described in the documentation, which were scribed in my presence. The recorded information has been reviewed and considered.     Krystle Polcyn B. Bernette Mayers, MD 07/02/12 [phone]

## 2012-07-02 NOTE — ED Notes (Signed)
Pt presents to department for evaluation of L lower molar pain. Ongoing x1 day. States issues with same tooth before, does not have dentist. 10/10 pain upon arrival. No facial swelling noted. Respirations unlabored. No signs of distress noted.

## 2012-08-20 ENCOUNTER — Emergency Department: Payer: Self-pay | Admitting: Emergency Medicine

## 2012-09-16 ENCOUNTER — Emergency Department (HOSPITAL_COMMUNITY)
Admission: EM | Admit: 2012-09-16 | Discharge: 2012-09-16 | Disposition: A | Discharge status: Home / Self Care | Payer: Self-pay | Attending: Emergency Medicine | Admitting: Emergency Medicine

## 2012-09-16 ENCOUNTER — Encounter (HOSPITAL_COMMUNITY): Payer: Self-pay | Admitting: Emergency Medicine

## 2012-09-16 DIAGNOSIS — F17200 Nicotine dependence, unspecified, uncomplicated: Secondary | ICD-10-CM | POA: Insufficient documentation

## 2012-09-16 DIAGNOSIS — X58XXXA Exposure to other specified factors, initial encounter: Secondary | ICD-10-CM | POA: Insufficient documentation

## 2012-09-16 DIAGNOSIS — K0889 Other specified disorders of teeth and supporting structures: Secondary | ICD-10-CM

## 2012-09-16 DIAGNOSIS — S025XXA Fracture of tooth (traumatic), initial encounter for closed fracture: Secondary | ICD-10-CM | POA: Insufficient documentation

## 2012-09-16 MED ORDER — OXYCODONE-ACETAMINOPHEN 5-325 MG PO TABS: 1.0000 | ORAL_TABLET | Freq: Four times a day (QID) | ORAL | Status: DC | PRN

## 2012-09-16 NOTE — ED Provider Notes (Signed)
History  Scribed for [employer]. Rubin Payor, MD, the patient was seen in room TR02C/TR02C. This chart was scribed by Candelaria Stagers. The patient's care started at 1:05 PM   CSN: [id number]  Arrival date & time 09/16/12  1232   None     Chief Complaint  Patient presents with  . Dental Pain    The history is provided by the patient. No language interpreter was used.   Cathy Thompson is a 46 y.o. female who presents to the Emergency Department complaining of right lower dental pain that became worse yesterday.  She is a pt at [employer] [facility] school and was not able to be seen yesterday for the pain.  She has used oragel and has taken antibiotics since the pain started.  She denies fever.    History reviewed. No pertinent past medical history.  Past Surgical History  Procedure Date  . Cholecystectomy   . Back surgery     No family history on file.  History  Substance Use Topics  . Smoking status: Current Every Day Smoker -- 0.5 packs/day    Types: Cigarettes  . Smokeless tobacco: Not on file  . Alcohol Use: No    OB History    Grav Para Term Preterm Abortions TAB SAB Ect Mult Living                  Review of Systems  Constitutional: Negative for fatigue.  HENT: Positive for dental problem.   All other systems reviewed and are negative.    Allergies  Review of patient's allergies indicates no known allergies.  Home Medications   Current Outpatient Rx  Name Route Sig Dispense Refill  . IBUPROFEN 200 MG PO TABS Oral Take 600 mg by mouth every 6 (six) hours as needed. For pain    . PENICILLIN V POTASSIUM 250 MG PO TABS Oral Take 250 mg by mouth 2 (two) times daily.    . OXYCODONE-ACETAMINOPHEN 5-325 MG PO TABS Oral Take 1-2 tablets by mouth every 6 (six) hours as needed for pain. 10 tablet 0    BP 156/75  Pulse 94  Temp 98.4 F (36.9 C) (Oral)  Resp 22  SpO2 99%  Physical Exam  Nursing note and vitals reviewed. Constitutional: She is oriented to person,  place, and time. She appears well-developed and well-nourished. No distress.  HENT:  Head: Normocephalic and atraumatic.       Right upper fourth tooth is broken off from the miline.  No fluctuant, no erythema.  Multiple previously extracted teeth.  On the left upper side there is a broken off tooth that is non tender with no erythema.   Eyes: EOM are normal. Pupils are equal, round, and reactive to light.  Neck: Normal range of motion.  Pulmonary/Chest: Effort normal. No respiratory distress.  Musculoskeletal: Normal range of motion. She exhibits no edema.  Neurological: She is alert and oriented to person, place, and time. No sensory deficit.  Skin: Skin is warm and dry.  Psychiatric: She has a normal mood and affect. Her behavior is normal.    ED Course  Procedures   DIAGNOSTIC STUDIES:   COORDINATION OF CARE:     Labs Reviewed - No data to display No results found.   1. Pain, dental       MDM  Patient with dental pain broken off tooth. She did seen at the dental school at [facility]. She states she was there yesterday was not seen. No apparent abscess. She  is already on penicillin that she had at home. She be given pain medicines and will followup with the clinic. No clear abscess. I personally performed the services described in this documentation, which was scribed in my presence. The recorded information has been reviewed and considered.          Juliet Rude. Rubin Payor, MD 09/16/12 1306

## 2012-09-16 NOTE — ED Notes (Signed)
Is having her teeth worked on and taken out and she did not get called this wed now having pain

## 2012-09-16 NOTE — ED Notes (Signed)
Pt reports dental pain after dental work.  Pt alert oriented X4

## 2012-10-08 ENCOUNTER — Encounter (HOSPITAL_COMMUNITY): Payer: Self-pay | Admitting: Nurse Practitioner

## 2012-10-08 ENCOUNTER — Emergency Department (HOSPITAL_COMMUNITY)
Admission: EM | Admit: 2012-10-08 | Discharge: 2012-10-08 | Disposition: A | Discharge status: Home / Self Care | Payer: Self-pay | Attending: Emergency Medicine | Admitting: Emergency Medicine

## 2012-10-08 DIAGNOSIS — K0889 Other specified disorders of teeth and supporting structures: Secondary | ICD-10-CM

## 2012-10-08 DIAGNOSIS — K089 Disorder of teeth and supporting structures, unspecified: Secondary | ICD-10-CM | POA: Insufficient documentation

## 2012-10-08 DIAGNOSIS — F17200 Nicotine dependence, unspecified, uncomplicated: Secondary | ICD-10-CM | POA: Insufficient documentation

## 2012-10-08 MED ORDER — PENICILLIN V POTASSIUM 500 MG PO TABS: 500.0000 mg | ORAL_TABLET | Freq: Four times a day (QID) | ORAL | Status: AC

## 2012-10-08 NOTE — ED Notes (Signed)
Pt states she had 3 upper back R teeth extracted at [facility] Wednesday. Since the extraction, increasing pain and swelling. States she looked at incisions and "they look bad."

## 2012-10-08 NOTE — ED Notes (Signed)
Pt left without discharge papers. edp aware

## 2012-10-08 NOTE — ED Provider Notes (Signed)
History   This chart was scribed for No att. providers found by Toya Smothers. The patient was seen in room TR06C/TR06C. Patient's care was started at 1348.  CSN: [id number]  Arrival date & time 10/08/12  1348   First MD Initiated Contact with Patient 10/08/12 1559      Chief Complaint  Patient presents with  . Dental Pain   The history is provided by the patient. No language interpreter was used.    Cathy Thompson is a 46 y.o. female who presents to the Emergency Department complaining of 1 day new worsening gradual onset right maxillary dental pain after extraction earlier this week. Associated symptoms inculde mild facial swelling and redness around the area of extraction. Pt reports that she has had multiple extraction on the lower and upper jaw within the past 2 weeks, though the pain and swelling is unlike any before. PTA Pt has not treated symptoms. Pt denies fever, chills, emesis, nausea, rash, and cough.   History reviewed. No pertinent past medical history.  Past Surgical History  Procedure Date  . Cholecystectomy   . Back surgery     History reviewed. No pertinent family history.  History  Substance Use Topics  . Smoking status: Current Every Day Smoker -- 0.5 packs/day    Types: Cigarettes  . Smokeless tobacco: Not on file  . Alcohol Use: No   Review of Systems  Unable to perform ROS HENT: Positive for facial swelling and dental problem.   All other systems reviewed and are negative.    Allergies  Review of patient's allergies indicates no known allergies.  Home Medications   Current Outpatient Rx  Name Route Sig Dispense Refill  . IBUPROFEN 200 MG PO TABS Oral Take 600 mg by mouth every 6 (six) hours as needed. For pain    . PENICILLIN V POTASSIUM 500 MG PO TABS Oral Take 1 tablet (500 mg total) by mouth 4 (four) times daily. 28 tablet 0    BP 150/91  Pulse 72  Temp 98.1 F (36.7 C) (Oral)  Resp 16  SpO2 97%  Physical Exam  Nursing note and  vitals reviewed. Constitutional: She is oriented to person, place, and time. She appears well-developed and well-nourished. No distress.  HENT:  Head: Normocephalic and atraumatic.       RU gum line has open wound without drainage or bleeding. Mild associated erythema, but no fluctuance or trismus.   Eyes: Conjunctivae normal and EOM are normal. Pupils are equal, round, and reactive to light.  Neck: Normal range of motion. Neck supple. No tracheal deviation present.  Cardiovascular: Normal rate.   Pulmonary/Chest: Effort normal and breath sounds normal. No respiratory distress.  Abdominal: Soft.  Musculoskeletal: Normal range of motion.  Neurological: She is alert and oriented to person, place, and time. No sensory deficit.  Skin: Skin is warm.  Psychiatric: She has a normal mood and affect. Her behavior is normal.    ED Course  Procedures  DIAGNOSTIC STUDIES: Oxygen Saturation is 97% on room air, normal by my interpretation.    COORDINATION OF CARE: 16:00- Evaluated Pt. Pt is awake, alert, and oriented. 16:04- Patient informed of clinical course, understand medical decision-making process, and agree with plan. Plan: Home Medications- Ibuprofen; Home Treatments- compress; Recommended follow up- Follow up with Dentist.   Labs Reviewed - No data to display No results found.   1. Pain, dental       MDM  Postoperative dental pain, with early infection. No evident abscess.  The patient is stable for discharge      I personally performed the services described in this documentation, which was scribed in my presence. The recorded information has been reviewed and considered.     Flint Melter, MD 10/08/12 1736

## 2012-11-21 ENCOUNTER — Emergency Department: Payer: Self-pay | Admitting: Internal Medicine

## 2012-12-08 IMAGING — CR SACRUM AND COCCYX - 2+ VIEW
1 series · 4 of 4 positions shown · non-contrast
Comparison: none

REASON FOR EXAM: fall, tailbone pain
COMMENTS:

PROCEDURE:     DXR - DXR SACRUM AND COCCYX  - April 04, 2012 [DATE]
RESULT:     Comparison: None.

[Series 2: t sacrum ap · 0.14mm/px · 4 of 4 slices shown]
[im 1/4]
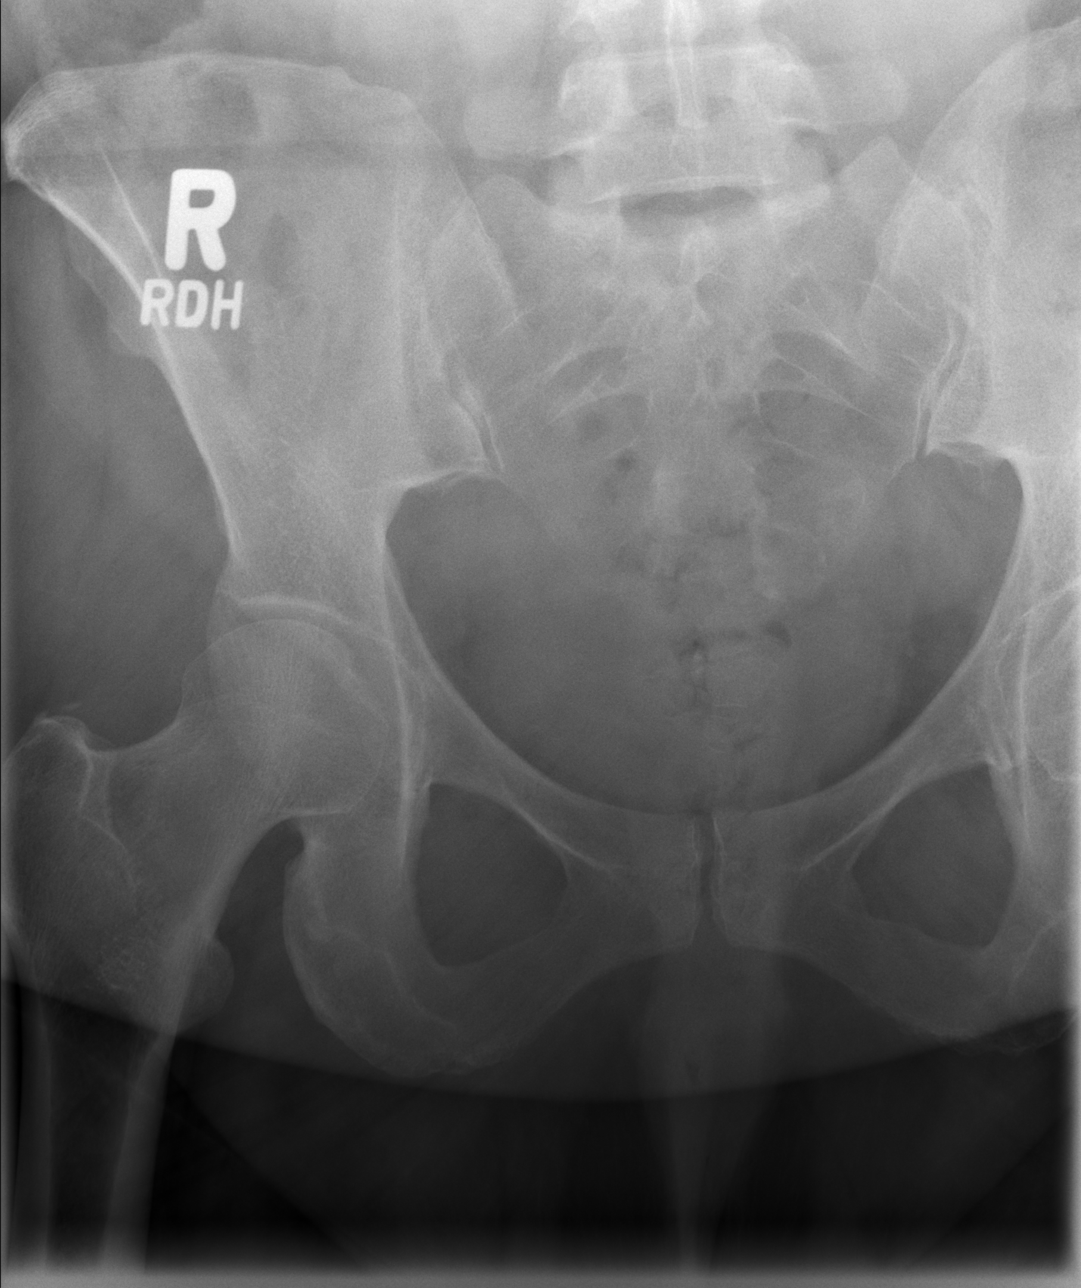
[im 2/4]
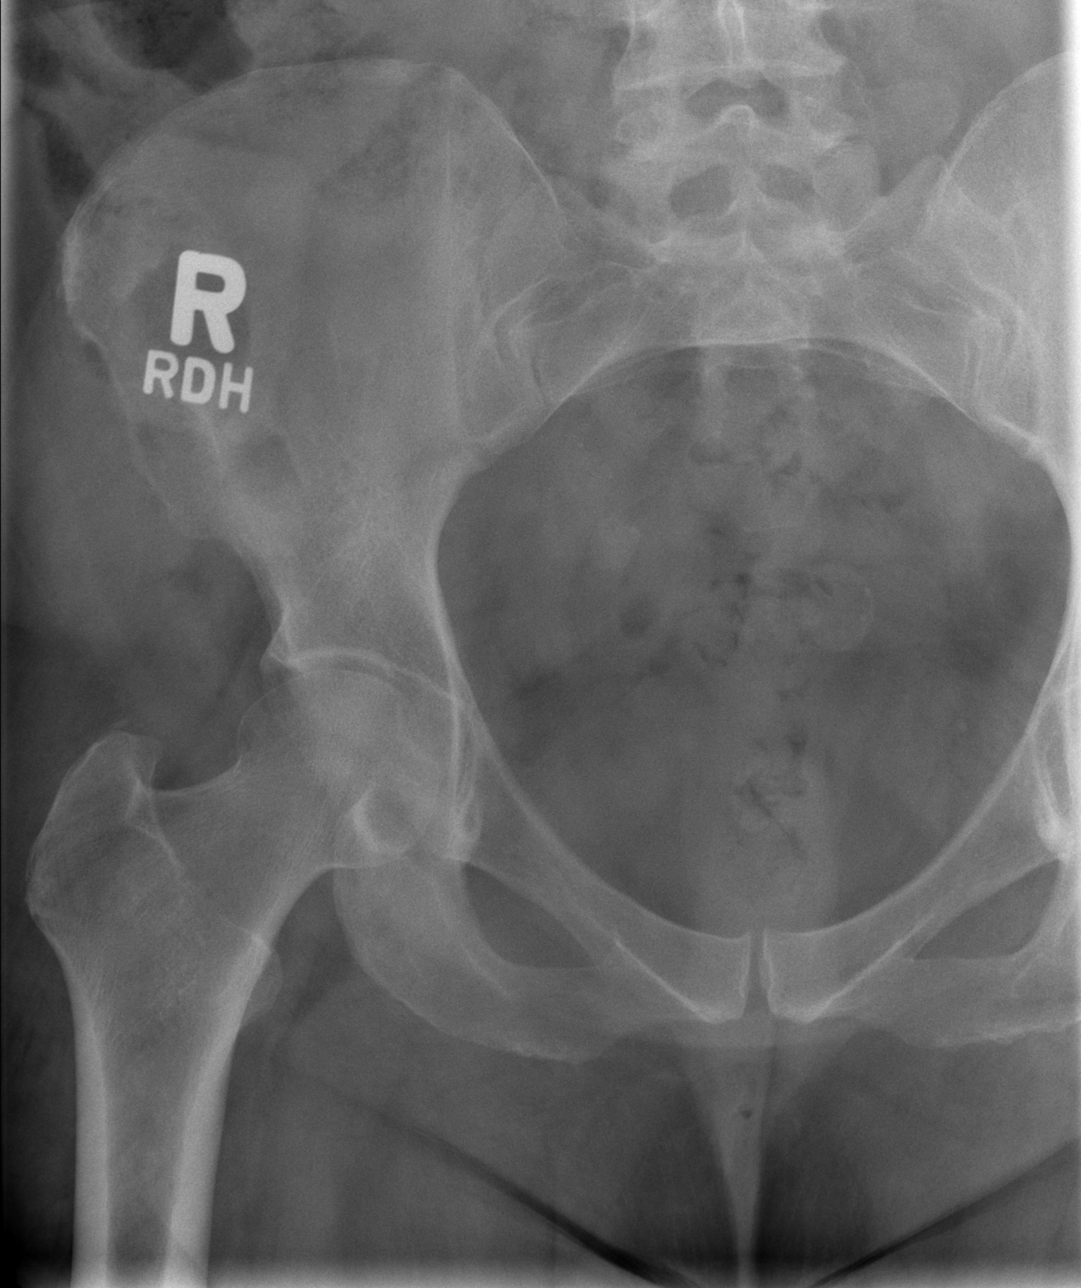
[im 3/4]
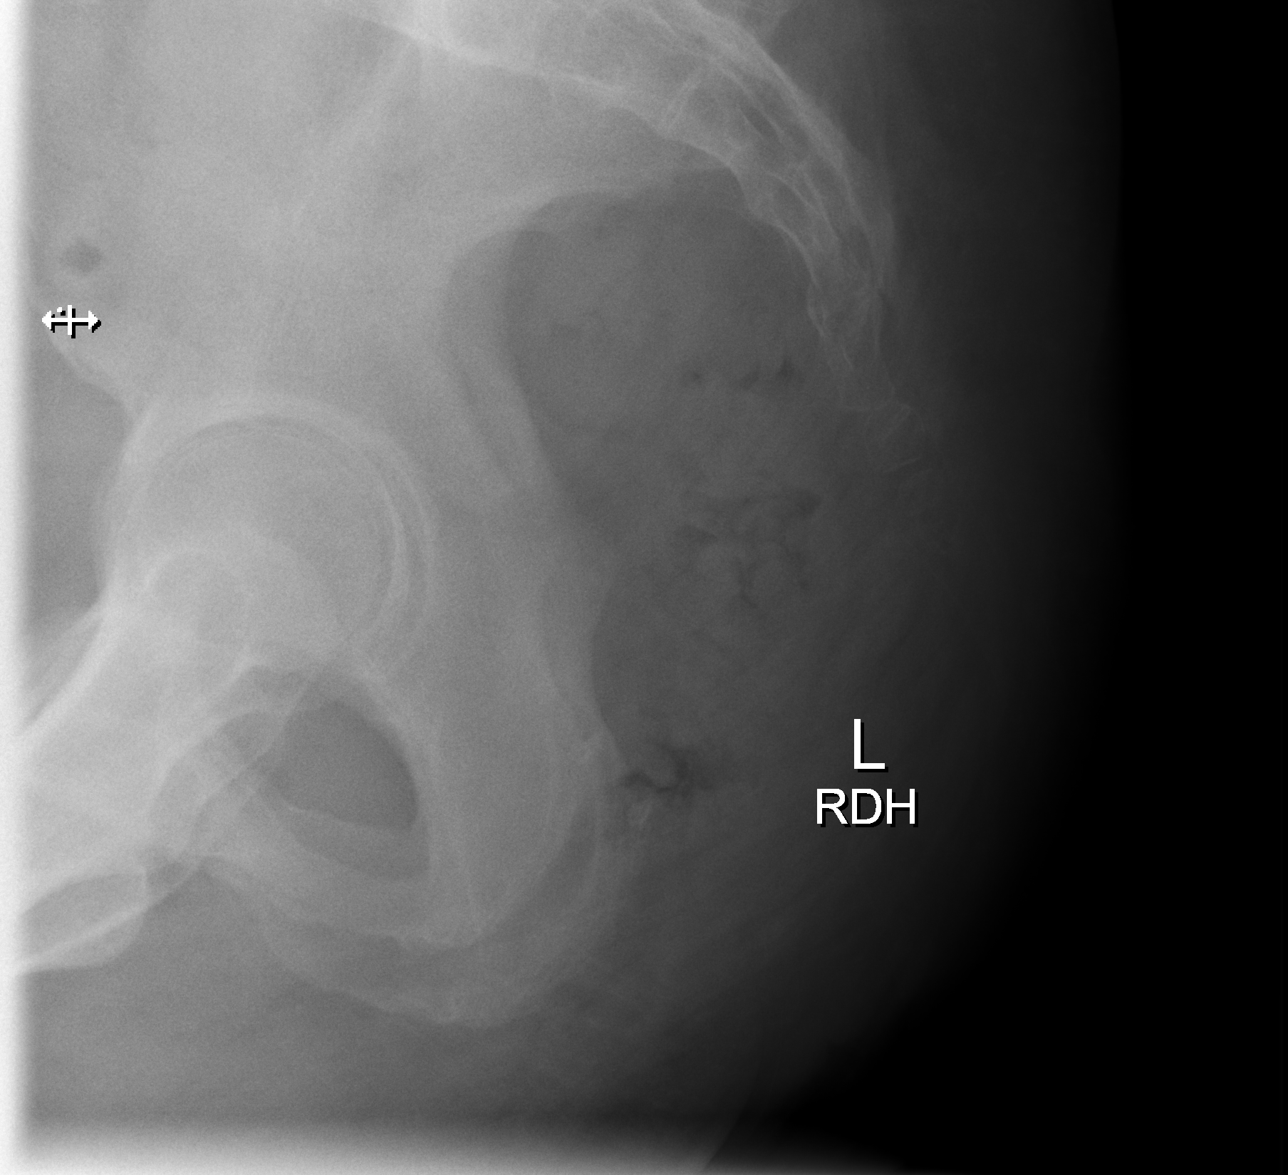
[im 4/4]
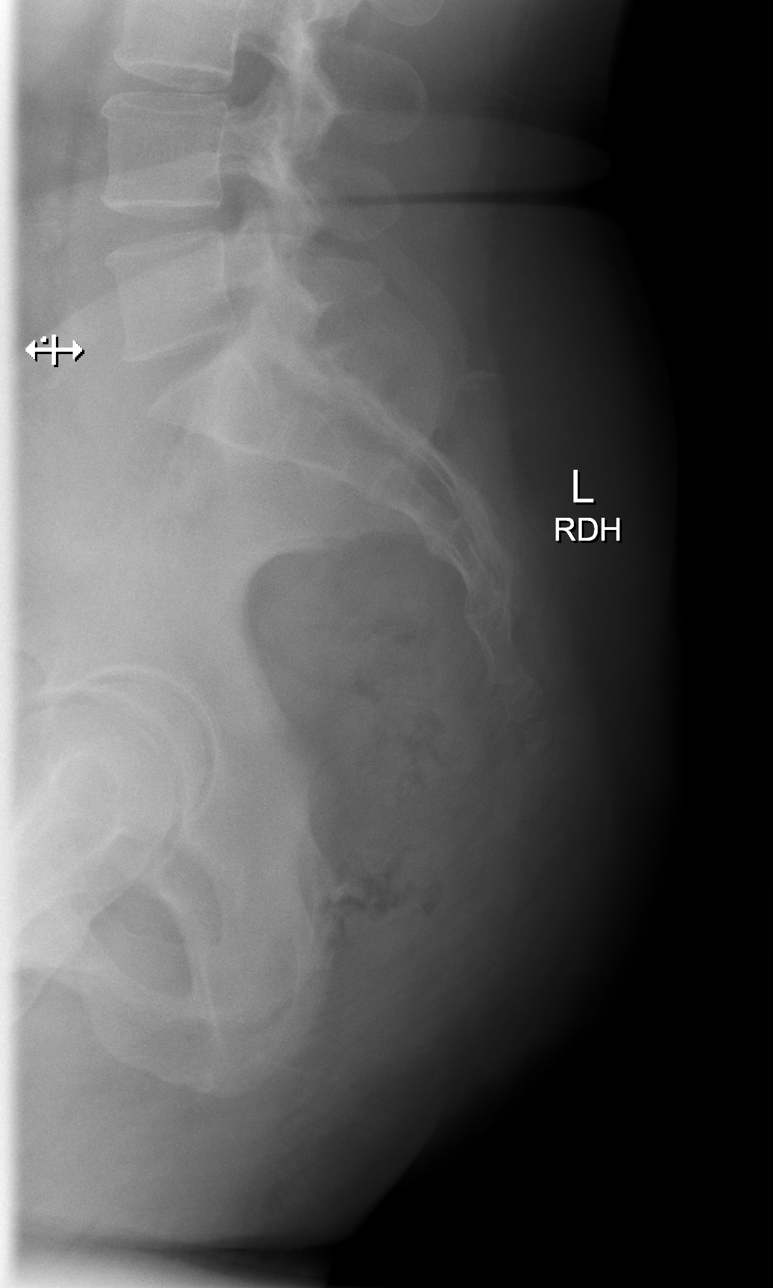

[4 of 4 positions shown; findings below may reference images not displayed]

FINDINGS: No displaced fracture identified.
IMPRESSION: No displaced fracture identified.

## 2013-03-08 ENCOUNTER — Inpatient Hospital Stay: Payer: Self-pay | Admitting: Internal Medicine

## 2013-03-08 LAB — URINALYSIS, COMPLETE
Bacteria: NONE SEEN
Bilirubin,UR: NEGATIVE
Glucose,UR: NEGATIVE mg/dL (ref 0–75)
Hyaline Cast: 1
RBC,UR: 677 /HPF (ref 0–5)
Specific Gravity: 1.024 (ref 1.003–1.030)
WBC UR: 2 /HPF (ref 0–5)

## 2013-03-08 LAB — COMPREHENSIVE METABOLIC PANEL
Albumin: 3 g/dL — ABNORMAL LOW (ref 3.4–5.0)
Alkaline Phosphatase: 136 U/L (ref 50–136)
BUN: 12 mg/dL (ref 7–18)
Bilirubin,Total: 0.6 mg/dL (ref 0.2–1.0)
Calcium, Total: 7.7 mg/dL — ABNORMAL LOW (ref 8.5–10.1)
Chloride: 97 mmol/L — ABNORMAL LOW (ref 98–107)
Co2: 26 mmol/L (ref 21–32)
Creatinine: 0.66 mg/dL (ref 0.60–1.30)
EGFR (Non-African Amer.): >60
Potassium: 4 mmol/L (ref 3.5–5.1)
Sodium: 130 mmol/L — ABNORMAL LOW (ref 136–145)
Total Protein: 8.2 g/dL (ref 6.4–8.2)

## 2013-03-08 LAB — CBC
HCT: 41.3 % (ref 35.0–47.0)
HGB: 13.7 g/dL (ref 12.0–16.0)
MCHC: 33.3 g/dL (ref 32.0–36.0)
RDW: 16.9 % — ABNORMAL HIGH (ref 11.5–14.5)
WBC: 9.4 10*3/uL (ref 3.6–11.0)

## 2013-03-08 LAB — PROTIME-INR
INR: 1.2
Prothrombin Time: 15.2 secs — ABNORMAL HIGH (ref 11.5–14.7)

## 2013-03-09 LAB — COMPREHENSIVE METABOLIC PANEL
Anion Gap: 7 (ref 7–16)
Bilirubin,Total: 0.5 mg/dL (ref 0.2–1.0)
Co2: 26 mmol/L (ref 21–32)
Creatinine: 0.71 mg/dL (ref 0.60–1.30)
Glucose: 108 mg/dL — ABNORMAL HIGH (ref 65–99)
Osmolality: 267 (ref 275–301)
Potassium: 3.6 mmol/L (ref 3.5–5.1)
Sodium: 133 mmol/L — ABNORMAL LOW (ref 136–145)

## 2013-03-09 LAB — LIPID PANEL
Cholesterol: 109 mg/dL (ref 0–200)
HDL Cholesterol: 32 mg/dL — ABNORMAL LOW (ref 40–60)
Ldl Cholesterol, Calc: 58 mg/dL (ref 0–100)
Triglycerides: 95 mg/dL (ref 0–200)
VLDL Cholesterol, Calc: 19 mg/dL (ref 5–40)

## 2013-03-09 LAB — MAGNESIUM: Magnesium: 1.8 mg/dL

## 2013-03-09 LAB — CBC WITH DIFFERENTIAL/PLATELET
Basophil #: 0 10*3/uL (ref 0.0–0.1)
HCT: 37.3 % (ref 35.0–47.0)
HGB: 12.8 g/dL (ref 12.0–16.0)
Lymphocyte #: 0.7 10*3/uL — ABNORMAL LOW (ref 1.0–3.6)
Lymphocyte %: 9.1 %
MCH: 26.6 pg (ref 26.0–34.0)
MCHC: 34.4 g/dL (ref 32.0–36.0)
Monocyte #: 0.7 x10 3/mm (ref 0.2–0.9)
Neutrophil %: 81.4 %
RDW: 16.6 % — ABNORMAL HIGH (ref 11.5–14.5)

## 2013-03-10 LAB — PRO B NATRIURETIC PEPTIDE: B-Type Natriuretic Peptide: 1146 pg/mL — ABNORMAL HIGH (ref 0–125)

## 2013-03-11 LAB — CBC WITH DIFFERENTIAL/PLATELET
Basophil #: 0 10*3/uL (ref 0.0–0.1)
Basophil %: 0.5 %
HCT: 35.7 % (ref 35.0–47.0)
HGB: 11.6 g/dL — ABNORMAL LOW (ref 12.0–16.0)
Lymphocyte #: 1.1 10*3/uL (ref 1.0–3.6)
Lymphocyte %: 21.4 %
MCHC: 32.5 g/dL (ref 32.0–36.0)
MCV: 77 fL — ABNORMAL LOW (ref 80–100)
Monocyte #: 0.5 x10 3/mm (ref 0.2–0.9)
Neutrophil %: 67.3 %
Platelet: 189 10*3/uL (ref 150–440)
RBC: 4.63 10*6/uL (ref 3.80–5.20)
WBC: 5 10*3/uL (ref 3.6–11.0)

## 2013-03-11 LAB — BASIC METABOLIC PANEL
Anion Gap: 1 — ABNORMAL LOW (ref 7–16)
Calcium, Total: 7.8 mg/dL — ABNORMAL LOW (ref 8.5–10.1)
Chloride: 104 mmol/L (ref 98–107)
Co2: 34 mmol/L — ABNORMAL HIGH (ref 21–32)
Creatinine: 0.66 mg/dL (ref 0.60–1.30)
Glucose: 96 mg/dL (ref 65–99)
Osmolality: 276 (ref 275–301)
Potassium: 3.7 mmol/L (ref 3.5–5.1)
Sodium: 139 mmol/L (ref 136–145)

## 2013-03-12 LAB — COMPREHENSIVE METABOLIC PANEL
Albumin: 2.5 g/dL — ABNORMAL LOW (ref 3.4–5.0)
Alkaline Phosphatase: 107 U/L (ref 50–136)
Anion Gap: 2 — ABNORMAL LOW (ref 7–16)
BUN: 14 mg/dL (ref 7–18)
Bilirubin,Total: 0.5 mg/dL (ref 0.2–1.0)
Calcium, Total: 8 mg/dL — ABNORMAL LOW (ref 8.5–10.1)
Chloride: 104 mmol/L (ref 98–107)
Creatinine: 0.66 mg/dL (ref 0.60–1.30)
EGFR (African American): >60
EGFR (Non-African Amer.): >60
Osmolality: 281 (ref 275–301)
Potassium: 3.9 mmol/L (ref 3.5–5.1)
SGOT(AST): 110 U/L — ABNORMAL HIGH (ref 15–37)

## 2013-03-12 LAB — CBC WITH DIFFERENTIAL/PLATELET
HCT: 38 % (ref 35.0–47.0)
HGB: 12 g/dL (ref 12.0–16.0)
MCV: 77 fL — ABNORMAL LOW (ref 80–100)
Monocyte #: 0.5 x10 3/mm (ref 0.2–0.9)
Monocyte %: 8.7 %
Platelet: 208 10*3/uL (ref 150–440)
RBC: 4.92 10*6/uL (ref 3.80–5.20)
RDW: 16.6 % — ABNORMAL HIGH (ref 11.5–14.5)

## 2013-03-14 LAB — CULTURE, BLOOD (SINGLE)

## 2013-05-24 ENCOUNTER — Emergency Department (HOSPITAL_COMMUNITY)
Admission: EM | Admit: 2013-05-24 | Discharge: 2013-05-24 | Disposition: A | Discharge status: Home / Self Care | Payer: Self-pay | Attending: Emergency Medicine | Admitting: Emergency Medicine

## 2013-05-24 ENCOUNTER — Encounter (HOSPITAL_COMMUNITY): Payer: Self-pay | Admitting: Radiology

## 2013-05-24 DIAGNOSIS — Y929 Unspecified place or not applicable: Secondary | ICD-10-CM | POA: Insufficient documentation

## 2013-05-24 DIAGNOSIS — K029 Dental caries, unspecified: Secondary | ICD-10-CM | POA: Insufficient documentation

## 2013-05-24 DIAGNOSIS — X58XXXA Exposure to other specified factors, initial encounter: Secondary | ICD-10-CM | POA: Insufficient documentation

## 2013-05-24 DIAGNOSIS — S025XXA Fracture of tooth (traumatic), initial encounter for closed fracture: Secondary | ICD-10-CM | POA: Insufficient documentation

## 2013-05-24 DIAGNOSIS — Y9389 Activity, other specified: Secondary | ICD-10-CM | POA: Insufficient documentation

## 2013-05-24 DIAGNOSIS — R030 Elevated blood-pressure reading, without diagnosis of hypertension: Secondary | ICD-10-CM | POA: Insufficient documentation

## 2013-05-24 DIAGNOSIS — F17200 Nicotine dependence, unspecified, uncomplicated: Secondary | ICD-10-CM | POA: Insufficient documentation

## 2013-05-24 DIAGNOSIS — K0889 Other specified disorders of teeth and supporting structures: Secondary | ICD-10-CM

## 2013-05-24 MED ORDER — KETOROLAC TROMETHAMINE 30 MG/ML IJ SOLN
30.0000 mg | Freq: Once | INTRAMUSCULAR | Status: AC
  Administered 2013-05-24: 30 mg via INTRAMUSCULAR
  Filled 2013-05-24: qty 1

## 2013-05-24 MED ORDER — AMOXICILLIN 500 MG PO CAPS: 500.0000 mg | ORAL_CAPSULE | Freq: Three times a day (TID) | ORAL | Status: DC

## 2013-05-24 MED ORDER — HYDROCODONE-ACETAMINOPHEN 5-325 MG PO TABS: 1.0000 | ORAL_TABLET | Freq: Four times a day (QID) | ORAL | Status: DC | PRN

## 2013-05-24 MED ORDER — IBUPROFEN 800 MG PO TABS: 800.0000 mg | ORAL_TABLET | Freq: Three times a day (TID) | ORAL | Status: DC

## 2013-05-24 NOTE — ED Notes (Signed)
Pt presents with broken tooth while eating fried chicken

## 2013-05-24 NOTE — ED Provider Notes (Signed)
History     CSN: [id number]  Arrival date & time 05/24/13  0865   First MD Initiated Contact with Patient 05/24/13 [phone]      Chief Complaint  Patient presents with  . Dental Pain    (Consider location/radiation/quality/duration/timing/severity/associated sxs/prior treatment) HPI Cathy Thompson is a 47 y.o. female who presents to ED with complaint of dental pain. States she has bad teeth, and yesterday right front tooth partially broke off while eating fried chicken. States unable to manage pain at home. Reports trying embasol and ibuprofen with no relief. States she goes to [facility] dental school for her dental care but they are closed for the summer. Denies fever, chills, facial swelling, swelling under the tongue, malaise.    History reviewed. No pertinent past medical history.  Past Surgical History  Procedure Laterality Date  . Cholecystectomy    . Back surgery      History reviewed. No pertinent family history.  History  Substance Use Topics  . Smoking status: Current Every Day Smoker -- 0.50 packs/day    Types: Cigarettes  . Smokeless tobacco: Not on file  . Alcohol Use: No    OB History   Grav Para Term Preterm Abortions TAB SAB Ect Mult Living                  Review of Systems  Constitutional: Negative for fever and chills.  HENT: Positive for dental problem. Negative for sore throat, facial swelling, trouble swallowing, neck pain and neck stiffness.   Respiratory: Negative.   Cardiovascular: Negative.   Skin: Negative for rash.    Allergies  Review of patient's allergies indicates no known allergies.  Home Medications   Current Outpatient Rx  Name  Route  Sig  Dispense  Refill  . ibuprofen (ADVIL,MOTRIN) 200 MG tablet   Oral   Take 600 mg by mouth every 6 (six) hours as needed. For pain           BP 166/73  Pulse 99  Temp(Src) 97.7 F (36.5 C)  Ht 5\' 4"  (1.626 m)  Wt 230 lb (104.327 kg)  BMI 39.46 kg/m2  SpO2 97%  Physical Exam  Nursing  note and vitals reviewed. Constitutional: She appears well-developed and well-nourished.  Tearful, appears to be in pain  HENT:  Head: Normocephalic.  No facial swelling. Widespread dental decay. Right central incisor decayed, partially broken off. No surrounding gum swelling or inflammation. Gingival disease noted. No swelling under the tongue  Eyes: Conjunctivae are normal.  Neck: Neck supple.  Cardiovascular: Normal rate, regular rhythm and normal heart sounds.   Pulmonary/Chest: Effort normal and breath sounds normal. No respiratory distress. She has no wheezes. She has no rales.  Lymphadenopathy:    She has no cervical adenopathy.  Neurological: She is alert.  Skin: Skin is warm and dry.    ED Course  Procedures (including critical care time)   1. Pain, dental   2. Elevated blood pressure       MDM  Pt with new dental pain, partial avulsed tooth after eating last night. Pt with widespread dental decay. Seen here several times for same complaints. No signs of infection. No facial swelling. Afebrile. Given toradol for pain ED, home with amoxil, ibuprofen, norco. Referral for follow up provided. BP elevated in ED, records reviewed, intermittently elevated in prior visits as well. Could be related to pain pt is currently in. Instructed to have it rechecked by PCP.   Filed Vitals:   05/24/13 0741  05/24/13 0745  BP: 170/146 166/73  Pulse: 99   Temp: 97.7 F (36.5 C)   Height: 5\' 4"  (1.626 m)   Weight: 230 lb (104.327 kg)   SpO2: 97%            Lottie Mussel, PA-C 05/24/13 [phone]

## 2013-05-24 NOTE — ED Provider Notes (Signed)
Medical screening examination/treatment/procedure(s) were performed by non-physician practitioner and as supervising physician I was immediately available for consultation/collaboration.   Cathy Thompson. Oletta Lamas, MD 05/24/13 1478

## 2013-05-24 NOTE — ED Notes (Signed)
PA at bedside.

## 2013-06-22 ENCOUNTER — Emergency Department (HOSPITAL_COMMUNITY)
Admission: EM | Admit: 2013-06-22 | Discharge: 2013-06-22 | Disposition: A | Discharge status: Home / Self Care | Payer: Self-pay | Attending: Emergency Medicine | Admitting: Emergency Medicine

## 2013-06-22 ENCOUNTER — Encounter (HOSPITAL_COMMUNITY): Payer: Self-pay | Admitting: Emergency Medicine

## 2013-06-22 DIAGNOSIS — K089 Disorder of teeth and supporting structures, unspecified: Secondary | ICD-10-CM | POA: Insufficient documentation

## 2013-06-22 DIAGNOSIS — F17200 Nicotine dependence, unspecified, uncomplicated: Secondary | ICD-10-CM | POA: Insufficient documentation

## 2013-06-22 DIAGNOSIS — K0889 Other specified disorders of teeth and supporting structures: Secondary | ICD-10-CM

## 2013-06-22 MED ORDER — TRAMADOL HCL 50 MG PO TABS
100.0000 mg | ORAL_TABLET | Freq: Once | ORAL | Status: AC
  Administered 2013-06-22: 100 mg via ORAL
  Filled 2013-06-22: qty 2

## 2013-06-22 MED ORDER — TRAMADOL HCL 50 MG PO TABS: 50.0000 mg | ORAL_TABLET | Freq: Four times a day (QID) | ORAL | Status: DC | PRN

## 2013-06-22 NOTE — ED Provider Notes (Signed)
History    CSN: [id number] Arrival date & time 06/22/13  1023  First MD Initiated Contact with Patient 06/22/13 1049     Chief Complaint  Patient presents with  . Dental Pain   (Consider location/radiation/quality/duration/timing/severity/associated sxs/prior Treatment) HPI Comments: 47 y.o. Female with significant dental pain hx presents today complaining of dental pain. Pt states that she went to [facility] on Friday where they removed 6 teeth. She states the only pain med they gave her was ibuprofen and she wants something stronger. She rated pain at a 10/10, characterized as throbbing in nature and located on the left upper and lower side of her mouth. Patient denies fever, night sweats, chills, difficulty swallowing or opening mouth, SOB, nuchal rigidity or decreased ROM of neck.    Patient is a 47 y.o. female presenting with tooth pain.  Dental Pain Associated symptoms: no drooling, no facial swelling, no fever, no headaches and no neck pain    History reviewed. No pertinent past medical history. Past Surgical History  Procedure Laterality Date  . Cholecystectomy    . Back surgery     No family history on file. History  Substance Use Topics  . Smoking status: Current Every Day Smoker -- 0.50 packs/day    Types: Cigarettes  . Smokeless tobacco: Not on file  . Alcohol Use: No   OB History   Grav Para Term Preterm Abortions TAB SAB Ect Mult Living                 Review of Systems  Constitutional: Negative for fever.  HENT: Positive for dental problem. Negative for sore throat, facial swelling, drooling, trouble swallowing, neck pain, neck stiffness and voice change.   Eyes: Negative for photophobia and visual disturbance.  Respiratory: Negative for shortness of breath.   Cardiovascular: Negative for chest pain.  Gastrointestinal: Negative for nausea and vomiting.  Musculoskeletal: Negative for back pain.  Skin: Negative for rash.  Neurological: Negative for weakness,  light-headedness, numbness and headaches.  Psychiatric/Behavioral: The patient is not nervous/anxious.     Allergies  Review of patient's allergies indicates no known allergies.  Home Medications   Current Outpatient Rx  Name  Route  Sig  Dispense  Refill  . ibuprofen (ADVIL,MOTRIN) 800 MG tablet   Oral   Take 1,600 mg by mouth every 8 (eight) hours as needed for pain.         Marland Kitchen OVER THE COUNTER MEDICATION   Topical   Apply 1 application topically daily as needed (Cream for psoriasis.  Applies to hands.).         Marland Kitchen traMADol (ULTRAM) 50 MG tablet   Oral   Take 1 tablet (50 mg total) by mouth every 6 (six) hours as needed for pain.   15 tablet   0    BP 150/86  Pulse 78  Temp(Src) 97.9 F (36.6 C) (Oral)  Resp 20  SpO2 96%  LMP 06/15/2013 Physical Exam  Nursing note and vitals reviewed. Constitutional: She is oriented to person, place, and time. She appears well-developed and well-nourished.  HENT:  Head: Normocephalic and atraumatic. No trismus in the jaw.  Mouth/Throat: No dental abscesses. No tonsillar abscesses.    Oropharynx is clear. Teeth appear carious. Many teeth pulled on right side, both upper and lower. No signs of infection.  Neck is nontender and supple without adenopathy or JVD.  Neck: Normal range of motion. Neck supple.  Cardiovascular: Normal rate.   Pulmonary/Chest: Effort normal. No respiratory distress.  She has no wheezes.  Abdominal: Soft. There is no tenderness.  Musculoskeletal: Normal range of motion.  Neurological: She is alert and oriented to person, place, and time. No cranial nerve deficit.  Skin: Skin is warm and dry.  Psychiatric: She has a normal mood and affect.    ED Course  Procedures (including critical care time) Labs Reviewed - No data to display No results found. 1. Pain, dental     MDM  Patient with dental pain.  No gross abscess.  Exam unconcerning for Ludwig's angina or spread of infection.  No signs of infection.  No abx indicated. Review of records indicates that narcotic pain meds are not appropriate at this time. Will treat with Ultram.  Urged patient to follow-up with dentist.  Provided resource material. Discussed options for treatment with pt who understands and is in agreement with discharge plan.    Glade Nurse, PA-C 06/22/13 1704

## 2013-06-22 NOTE — ED Notes (Signed)
Patient states "[facility] and [facility] teeth.   I have stitches.  They didn't prescribe me anything and I need something."

## 2013-06-23 NOTE — ED Provider Notes (Signed)
Medical screening examination/treatment/procedure(s) were performed by non-physician practitioner and as supervising physician I was immediately available for consultation/collaboration.  Olivia Mackie, MD 06/23/13 [phone]

## 2013-09-03 ENCOUNTER — Encounter (HOSPITAL_COMMUNITY): Payer: Self-pay | Admitting: *Deleted

## 2013-09-03 ENCOUNTER — Emergency Department (HOSPITAL_COMMUNITY)
Admission: EM | Admit: 2013-09-03 | Discharge: 2013-09-03 | Disposition: A | Discharge status: Home / Self Care | Payer: Self-pay | Attending: Emergency Medicine | Admitting: Emergency Medicine

## 2013-09-03 DIAGNOSIS — G8929 Other chronic pain: Secondary | ICD-10-CM | POA: Insufficient documentation

## 2013-09-03 DIAGNOSIS — K0889 Other specified disorders of teeth and supporting structures: Secondary | ICD-10-CM

## 2013-09-03 DIAGNOSIS — K089 Disorder of teeth and supporting structures, unspecified: Secondary | ICD-10-CM | POA: Insufficient documentation

## 2013-09-03 DIAGNOSIS — F17200 Nicotine dependence, unspecified, uncomplicated: Secondary | ICD-10-CM | POA: Insufficient documentation

## 2013-09-03 HISTORY — DX: Dental caries, unspecified: K02.9

## 2013-09-03 HISTORY — DX: Other chronic pain: G89.29

## 2013-09-03 HISTORY — DX: Dorsalgia, unspecified: M54.9

## 2013-09-03 MED ORDER — IBUPROFEN 800 MG PO TABS: 800.0000 mg | ORAL_TABLET | Freq: Three times a day (TID) | ORAL | Status: DC | PRN

## 2013-09-03 MED ORDER — HYDROCODONE-ACETAMINOPHEN 5-325 MG PO TABS: 1.0000 | ORAL_TABLET | Freq: Once | ORAL | Status: DC

## 2013-09-03 MED ORDER — HYDROCODONE-ACETAMINOPHEN 5-325 MG PO TABS: 1.0000 | ORAL_TABLET | Freq: Four times a day (QID) | ORAL | Status: DC | PRN

## 2013-09-03 NOTE — ED Notes (Signed)
Pt c/o right upper gum pain - states had teeth removed x 2 days ago at [facility].

## 2013-09-03 NOTE — ED Provider Notes (Signed)
CSN: [id number]     Arrival date & time 09/03/13  1349 History   First MD Initiated Contact with Patient 09/03/13 1427     No chief complaint on file.  (Consider location/radiation/quality/duration/timing/severity/associated sxs/prior Treatment) HPI Patient presents emergency department with dental pain.  She states she had several teeth pulled and sutures placed in her gums.  She states that this was Thursday, and she had this done at the [facility].  She states he did not give pain medicines and she states that left ever tramadol and ibuprofen are not helping.  Patient denies nausea, vomiting, neck swelling, difficulty swallowing, difficulty breathing, fever, or syncope.  The patient, states, that palpation makes her pain, worse. No past medical history on file. Past Surgical History  Procedure Laterality Date  . Cholecystectomy    . Back surgery     No family history on file. History  Substance Use Topics  . Smoking status: Current Every Day Smoker -- 0.50 packs/day    Types: Cigarettes  . Smokeless tobacco: Not on file  . Alcohol Use: No   OB History   Grav Para Term Preterm Abortions TAB SAB Ect Mult Living                 Review of Systems All other systems negative except as documented in the HPI. All pertinent positives and negatives as reviewed in the HPI. Allergies  Review of patient's allergies indicates no known allergies.  Home Medications   Current Outpatient Rx  Name  Route  Sig  Dispense  Refill  . ibuprofen (ADVIL,MOTRIN) 800 MG tablet   Oral   Take 1,600 mg by mouth every 8 (eight) hours as needed for pain.         Marland Kitchen OVER THE COUNTER MEDICATION   Topical   Apply 1 application topically daily as needed (Cream for psoriasis.  Applies to hands.).         Marland Kitchen traMADol (ULTRAM) 50 MG tablet   Oral   Take 1 tablet (50 mg total) by mouth every 6 (six) hours as needed for pain.   15 tablet   0    There were no vitals taken for this visit. Physical  Exam  Constitutional: She appears well-developed and well-nourished.  HENT:  Patient has several areas of suture in the upper gum line and were tooth extractions have occurred.  There is no signs of infection at this time  Neck: Normal range of motion. Neck supple. No tracheal deviation present.  Pulmonary/Chest: No stridor.  Lymphadenopathy:    She has no cervical adenopathy.    ED Course  Procedures (including critical care time) Patient be asked to followup with the dental clinic.  Also advised to rest with warm water and peroxide 3 times a day.  Told to return here as needed  MDM      Carlyle Dolly, PA-C 09/03/13 1430

## 2013-09-04 NOTE — ED Provider Notes (Signed)
History/physical exam/procedure(s) were performed by non-physician practitioner and as supervising physician I was immediately available for consultation/collaboration. I have reviewed all notes and am in agreement with care and plan.   Hilario Quarry, MD 09/04/13 [phone]

## 2013-09-17 ENCOUNTER — Emergency Department: Payer: Self-pay | Admitting: Emergency Medicine

## 2013-10-17 ENCOUNTER — Emergency Department: Payer: Self-pay | Admitting: Emergency Medicine

## 2013-11-10 ENCOUNTER — Emergency Department (HOSPITAL_COMMUNITY)
Admission: EM | Admit: 2013-11-10 | Discharge: 2013-11-10 | Disposition: A | Discharge status: Home / Self Care | Payer: Self-pay | Attending: Emergency Medicine | Admitting: Emergency Medicine

## 2013-11-10 ENCOUNTER — Encounter (HOSPITAL_COMMUNITY): Payer: Self-pay | Admitting: Emergency Medicine

## 2013-11-10 DIAGNOSIS — K089 Disorder of teeth and supporting structures, unspecified: Secondary | ICD-10-CM | POA: Insufficient documentation

## 2013-11-10 DIAGNOSIS — G8929 Other chronic pain: Secondary | ICD-10-CM | POA: Insufficient documentation

## 2013-11-10 DIAGNOSIS — K0889 Other specified disorders of teeth and supporting structures: Secondary | ICD-10-CM

## 2013-11-10 DIAGNOSIS — F17200 Nicotine dependence, unspecified, uncomplicated: Secondary | ICD-10-CM | POA: Insufficient documentation

## 2013-11-10 MED ORDER — OXYCODONE-ACETAMINOPHEN 5-325 MG PO TABS: 1.0000 | ORAL_TABLET | Freq: Three times a day (TID) | ORAL | Status: DC | PRN

## 2013-11-10 MED ORDER — IBUPROFEN 400 MG PO TABS
800.0000 mg | ORAL_TABLET | Freq: Once | ORAL | Status: AC
  Administered 2013-11-10: 800 mg via ORAL
  Filled 2013-11-10: qty 2

## 2013-11-10 MED ORDER — PENICILLIN V POTASSIUM 500 MG PO TABS: 500.0000 mg | ORAL_TABLET | Freq: Four times a day (QID) | ORAL | Status: AC

## 2013-11-10 NOTE — ED Provider Notes (Signed)
CSN: [id number]     Arrival date & time 11/10/13  4098 History  This chart was scribed for non-physician practitioner working with Audree Camel, MD by Ashley Jacobs, ED scribe. This patient was seen in room TR06C/TR06C and the patient's care was started at 9:39 AM.   None    Chief Complaint  Patient presents with  . Dental Pain   (Consider location/radiation/quality/duration/timing/severity/associated sxs/prior Treatment) HPI HPI Comments: Cathy Thompson is a 47 y.o. female who presents to the Emergency Department complaining of severe left lower dental pain that is worse for the past two days. Pt has a hx of chronic dental caries. She describes the pain as sharp pain. Pt has tried Ibuprofen and multiple other OTC without relief. She has an appointment set at the dental school to have her teeth extracted for dentures.  Pt denies difficulty swallowing, fever, chills, facial swelling, chest pain, shortness of breath, difficulty breathing, neck pain, neck stiffness, ear pain, nausea, vomiting, and headache. She does not have any known allergies to medications or any medical complications. Pt currently smokes .5 packs of cigarettes a day and does not alcohol.  PCP none   Past Medical History  Diagnosis Date  . Back pain, chronic   . Dental caries    Past Surgical History  Procedure Laterality Date  . Cholecystectomy    . Back surgery     No family history on file. History  Substance Use Topics  . Smoking status: Current Every Day Smoker -- 0.50 packs/day    Types: Cigarettes  . Smokeless tobacco: Not on file  . Alcohol Use: No   OB History   Grav Para Term Preterm Abortions TAB SAB Ect Mult Living                 Review of Systems  Constitutional: Negative for fever and chills.  HENT: Positive for dental problem. Negative for ear pain and sore throat.   Respiratory: Negative for shortness of breath.   Cardiovascular: Negative.  Negative for chest pain.  Gastrointestinal:  Negative for nausea and vomiting.  Musculoskeletal: Negative for neck pain and neck stiffness.  Neurological: Negative for headaches.  All other systems reviewed and are negative.    Allergies  Review of patient's allergies indicates no known allergies.  Home Medications   Current Outpatient Rx  Name  Route  Sig  Dispense  Refill  . ibuprofen (ADVIL,MOTRIN) 200 MG tablet   Oral   Take 800 mg by mouth every 6 (six) hours as needed for pain.          BP 158/73  Pulse 77  Temp(Src) 97.8 F (36.6 C) (Oral)  Resp 18  Ht 5\' 4"  (1.626 m)  Wt 252 lb 7 oz (114.505 kg)  BMI 43.31 kg/m2  SpO2 96%  LMP 11/07/2013 Physical Exam  Nursing note and vitals reviewed. Constitutional: She is oriented to person, place, and time. She appears well-developed and well-nourished. No distress.  HENT:  Head: Normocephalic and atraumatic.  Mouth/Throat: Oropharynx is clear and moist. No oropharyngeal exudate.  Negative facial swelling Poor dentition identified. Patient has numerous teeth pulled out. Remaining teeth localized to the incisors and canines of the lower mandible. Right canine of mandible jawline in decaying process. Central incisor of the maxillary region in decaying process. Pain upon palpation to right canine the mandible. Negative signs of abscess or cyst formation. Negative trismus. Negative sublingual lesions. Uvula midline, symmetrical elevation. Negative signs of peritonsillar abscess.  Eyes: Conjunctivae and EOM  are normal. Pupils are equal, round, and reactive to light. Right eye exhibits no discharge. Left eye exhibits no discharge.  Neck: Normal range of motion. Neck supple. No tracheal deviation present.  Negative neck stiffness Negative nuchal rigidity Negative lymphadenopathy  Cardiovascular: Normal rate, regular rhythm and normal heart sounds.  Exam reveals no friction rub.   No murmur heard. Pulses:      Radial pulses are 2+ on the right side, and 2+ on the left side.   Pulmonary/Chest: Effort normal and breath sounds normal. No respiratory distress. She has no wheezes. She has no rales.  Lymphadenopathy:    She has no cervical adenopathy.  Neurological: She is alert and oriented to person, place, and time. She exhibits normal muscle tone. Coordination normal.  Skin: Skin is warm and dry. No rash noted. She is not diaphoretic. No erythema.  Psychiatric: She has a normal mood and affect. Her behavior is normal. Thought content normal.    ED Course  Procedures (including critical care time) DIAGNOSTIC STUDIES: Oxygen Saturation is 96% on room air, normal by my interpretation.    COORDINATION OF CARE: 9:44 AM Discussed course of care with pt . Pt understands and agrees.  This provider reviewed patient's chart. Patient is been seen in the emergency department numerous times regarding dental pain. Patient currently seeing the dental clinic where teeth are in process of being pulled for denture placement.  Labs Review Labs Reviewed - No data to display Imaging Review No results found.  EKG Interpretation   None       MDM   1. Chronic dental pain   2. Toothache    Medications  ibuprofen (ADVIL,MOTRIN) tablet 800 mg (800 mg Oral Given 11/10/13 0957)   Filed Vitals:   11/10/13 0836  BP: 158/73  Pulse: 77  Temp: 97.8 F (36.6 C)  TempSrc: Oral  Resp: 18  Height: 5\' 4"  (1.626 m)  Weight: 252 lb 7 oz (114.505 kg)  SpO2: 96%    I personally performed the services described in this documentation, which was scribed in my presence. The recorded information has been reviewed and is accurate.  Patient presenting to emergency department with dental pain that started approximately 2 days ago localized to the mandible jawline. Alert and oriented. Negative facial swelling. Poor dentition identified with numerous teeth pulled to maxillary and mandible jaw. Remaining teeth in decaying process. Discomfort upon palpation to right canine of mandible  jaw-decaying noted. Negative abscess or cyst formation. Negative trismus. Negative sublingual lesions noted. Uvula midline, symmetrical elevation. Negative findings for peritonsillar abscess. Negative findings for Ludwig's angina. Chronic dental pain due to poor dentition. Patient stable, afebrile. Discharge patient with small dose of pain medications - discussed with patient course, precautions, disposal technique. Discharged patient with antibiotics. Referred patient to dental clinic. Numerous referrals given for dentists. Discussed with patient to rest and stay hydrated. Discussed with patient to closely monitor symptoms and if symptoms are to worsen or change to report back to emergency department - strict return instructions given. Patient agreed to plan of care, understood, all questions answered     Raymon Mutton, PA-C 11/10/13 2105

## 2013-11-10 NOTE — ED Notes (Signed)
Pt has hx of many dental carries.  Is in process of having all her teeth pulled.  Presently c/o L lower tooth pain.

## 2013-11-11 NOTE — ED Provider Notes (Signed)
Medical screening examination/treatment/procedure(s) were performed by non-physician practitioner and as supervising physician I was immediately available for consultation/collaboration.  EKG Interpretation   None         Audree Camel, MD 11/11/13 [phone]

## 2013-12-02 ENCOUNTER — Encounter (HOSPITAL_COMMUNITY): Payer: Self-pay | Admitting: Emergency Medicine

## 2013-12-02 ENCOUNTER — Emergency Department (HOSPITAL_COMMUNITY)
Admission: EM | Admit: 2013-12-02 | Discharge: 2013-12-02 | Disposition: A | Discharge status: Home / Self Care | Payer: Self-pay | Attending: Emergency Medicine | Admitting: Emergency Medicine

## 2013-12-02 DIAGNOSIS — K089 Disorder of teeth and supporting structures, unspecified: Secondary | ICD-10-CM | POA: Insufficient documentation

## 2013-12-02 DIAGNOSIS — Z9889 Other specified postprocedural states: Secondary | ICD-10-CM | POA: Insufficient documentation

## 2013-12-02 DIAGNOSIS — F17200 Nicotine dependence, unspecified, uncomplicated: Secondary | ICD-10-CM | POA: Insufficient documentation

## 2013-12-02 DIAGNOSIS — M549 Dorsalgia, unspecified: Secondary | ICD-10-CM | POA: Insufficient documentation

## 2013-12-02 DIAGNOSIS — K0889 Other specified disorders of teeth and supporting structures: Secondary | ICD-10-CM

## 2013-12-02 DIAGNOSIS — K08109 Complete loss of teeth, unspecified cause, unspecified class: Secondary | ICD-10-CM | POA: Insufficient documentation

## 2013-12-02 DIAGNOSIS — G8929 Other chronic pain: Secondary | ICD-10-CM | POA: Insufficient documentation

## 2013-12-02 DIAGNOSIS — K029 Dental caries, unspecified: Secondary | ICD-10-CM | POA: Insufficient documentation

## 2013-12-02 MED ORDER — HYDROCODONE-ACETAMINOPHEN 5-325 MG PO TABS: 1.0000 | ORAL_TABLET | ORAL | Status: DC | PRN

## 2013-12-02 NOTE — ED Provider Notes (Signed)
CSN: [id number]     Arrival date & time 12/02/13  0654 History   First MD Initiated Contact with Patient 12/02/13 0654     Chief Complaint  Patient presents with  . Dental Pain   (Consider location/radiation/quality/duration/timing/severity/associated sxs/prior Treatment) Patient is a 47 y.o. female presenting with tooth pain. The history is provided by the patient and medical records.  Dental Pain  This is a 47 year old female presenting to the ED for upper central incisors for the past few days.  pain described as throbbing sensations with intermittent sharp pains. Pt is being seen and treated by [facility] and has been progressively having her teeth extracted but FU is not until February when they re-open after the holidays.  Denies any fevers, sweats, chills.  No numbness or paresthesias of face.  No difficulty swallowing or breathing.  Has tried taking motrin and tramadol without noted improvement.   Past Medical History  Diagnosis Date  . Back pain, chronic   . Dental caries    Past Surgical History  Procedure Laterality Date  . Cholecystectomy    . Back surgery     No family history on file. History  Substance Use Topics  . Smoking status: Current Every Day Smoker -- 0.50 packs/day    Types: Cigarettes  . Smokeless tobacco: Not on file  . Alcohol Use: No   OB History   Grav Para Term Preterm Abortions TAB SAB Ect Mult Living                 Review of Systems  HENT: Positive for dental problem.   All other systems reviewed and are negative.    Allergies  Review of patient's allergies indicates no known allergies.  Home Medications   Current Outpatient Rx  Name  Route  Sig  Dispense  Refill  . ibuprofen (ADVIL,MOTRIN) 200 MG tablet   Oral   Take 800 mg by mouth every 6 (six) hours as needed for pain.         . traMADol (ULTRAM) 50 MG tablet   Oral   Take by mouth every 6 (six) hours as needed for moderate pain.         Marland Kitchen  HYDROcodone-acetaminophen (NORCO/VICODIN) 5-325 MG per tablet   Oral   Take 1 tablet by mouth every 4 (four) hours as needed.   12 tablet   0    BP 131/74  Pulse 71  Temp(Src) 98.4 F (36.9 C) (Oral)  Resp 20  SpO2 100%  LMP 11/07/2013  Physical Exam  Nursing note and vitals reviewed. Constitutional: She is oriented to person, place, and time. She appears well-developed and well-nourished. No distress.  HENT:  Head: Normocephalic and atraumatic.  Mouth/Throat: Uvula is midline, oropharynx is clear and moist and mucous membranes are normal. No trismus in the jaw. Abnormal dentition. Dental caries present. No dental abscesses. No oropharyngeal exudate, posterior oropharyngeal edema, posterior oropharyngeal erythema or tonsillar abscesses.  Most upper teeth have been extracted; remaining dentition is poor; upper central incisors with large cavities present, surrounding gingiva normal in appearance, no dental abscess, handling secretions appropriately, no trismus  Eyes: Conjunctivae and EOM are normal. Pupils are equal, round, and reactive to light.  Neck: Normal range of motion.  Cardiovascular: Normal rate, regular rhythm and normal heart sounds.   Pulmonary/Chest: Effort normal and breath sounds normal. No respiratory distress. She has no wheezes.  Musculoskeletal: Normal range of motion.  Neurological: She is alert and oriented to person,  place, and time.  Skin: Skin is warm and dry. She is not diaphoretic.  Psychiatric: She has a normal mood and affect.    ED Course  Procedures (including critical care time) Labs Review Labs Reviewed - No data to display Imaging Review No results found.  EKG Interpretation   None       MDM   1. Pain, dental    Dental pain without signs of dental abscess.  Rx vicodin.  Instructed to FU with dentist as soon as possible.  Discussed plan with pt, they agreed.  Return precautions advised.  Garlon Hatchet, PA-C 12/02/13 [phone]

## 2013-12-02 NOTE — ED Provider Notes (Signed)
Medical screening examination/treatment/procedure(s) were performed by non-physician practitioner and as supervising physician I was immediately available for consultation/collaboration.  EKG Interpretation   None        Darlys Gales, MD 12/02/13 [phone]

## 2013-12-02 NOTE — ED Notes (Signed)
Pt states she is having pain in her top front tooth.  Pt states she has been seen at the [facility] and is working on having them all removed.

## 2013-12-02 NOTE — ED Notes (Signed)
Report received, assumed care.  

## 2014-02-08 ENCOUNTER — Emergency Department (HOSPITAL_COMMUNITY)
Admission: EM | Admit: 2014-02-08 | Discharge: 2014-02-08 | Disposition: A | Discharge status: Home / Self Care | Payer: Self-pay | Attending: Emergency Medicine | Admitting: Emergency Medicine

## 2014-02-08 ENCOUNTER — Encounter (HOSPITAL_COMMUNITY): Payer: Self-pay | Admitting: Emergency Medicine

## 2014-02-08 DIAGNOSIS — Z8739 Personal history of other diseases of the musculoskeletal system and connective tissue: Secondary | ICD-10-CM | POA: Insufficient documentation

## 2014-02-08 DIAGNOSIS — K029 Dental caries, unspecified: Secondary | ICD-10-CM | POA: Insufficient documentation

## 2014-02-08 DIAGNOSIS — K089 Disorder of teeth and supporting structures, unspecified: Secondary | ICD-10-CM | POA: Insufficient documentation

## 2014-02-08 DIAGNOSIS — G8929 Other chronic pain: Secondary | ICD-10-CM | POA: Insufficient documentation

## 2014-02-08 DIAGNOSIS — F17200 Nicotine dependence, unspecified, uncomplicated: Secondary | ICD-10-CM | POA: Insufficient documentation

## 2014-02-08 DIAGNOSIS — K002 Abnormalities of size and form of teeth: Secondary | ICD-10-CM | POA: Insufficient documentation

## 2014-02-08 MED ORDER — HYDROCODONE-ACETAMINOPHEN 5-325 MG PO TABS: 1.0000 | ORAL_TABLET | Freq: Four times a day (QID) | ORAL | Status: DC | PRN

## 2014-02-08 NOTE — ED Notes (Signed)
C/o upper dental pain since yesterday.  States she is taking ibuprofen and left over antibiotics since yesterday.

## 2014-02-08 NOTE — ED Provider Notes (Signed)
CSN: [id number]     Arrival date & time 02/08/14  1526 History  This chart was scribed for non-physician practitioner Dierdre ForthHannah Casady Voshell, PA-C working with Shelda JakesScott W. Zackowski, MD by Donne Anonayla Curran, ED Scribe. This patient was seen in room TR11C/TR11C and the patient's care was started at 1655.   First MD Initiated Contact with Patient 02/08/14 1655     Chief Complaint  Patient presents with  . Dental Pain       The history is provided by the patient and medical records. No language interpreter was used.   HPI Comments: Cathy Thompson is a 48 y.o. female who presents to the Emergency Department complaining of gradual onset, gradually worsening, constant upper left sided "dull, aching and shooting" dental pain that began yesterday when she woke up. She reports a hx of dental carries, and has recently been seeing a dental school to have several of her teeth pulled, and states she cannot have this tooth pulled until next week. She states the dental school has given her a standing prescription of Penicillin, and she used one of her refills last night, but it has not provided any relief thus far. Air and hot and cold liquids make the pain worse. She tried topical numbing cream and ibuprofen with little relief. She denies facial swelling, fever, chills, nausea, vomiting, difficulty swallowing or any other symptoms.   Past Medical History  Diagnosis Date  . Back pain, chronic   . Dental caries    Past Surgical History  Procedure Laterality Date  . Cholecystectomy    . Back surgery     No family history on file. History  Substance Use Topics  . Smoking status: Current Every Day Smoker -- 0.50 packs/day    Types: Cigarettes  . Smokeless tobacco: Not on file  . Alcohol Use: No   OB History   Grav Para Term Preterm Abortions TAB SAB Ect Mult Living                 Review of Systems  Constitutional: Negative for fever, diaphoresis, appetite change, fatigue and unexpected weight change.   HENT: Positive for dental problem. Negative for facial swelling, mouth sores and trouble swallowing.   Eyes: Negative for visual disturbance.  Respiratory: Negative for cough, chest tightness, shortness of breath and wheezing.   Cardiovascular: Negative for chest pain.  Gastrointestinal: Negative for nausea, vomiting, abdominal pain, diarrhea and constipation.  Endocrine: Negative for polydipsia, polyphagia and polyuria.  Genitourinary: Negative for dysuria, urgency, frequency and hematuria.  Musculoskeletal: Negative for back pain and neck stiffness.  Skin: Negative for rash.  Allergic/Immunologic: Negative for immunocompromised state.  Neurological: Negative for syncope, light-headedness and headaches.  Hematological: Does not bruise/bleed easily.  Psychiatric/Behavioral: Negative for sleep disturbance. The patient is not nervous/anxious.      Allergies  Review of patient's allergies indicates no known allergies.  Home Medications   Current Outpatient Rx  Name  Route  Sig  Dispense  Refill  . HYDROcodone-acetaminophen (NORCO/VICODIN) 5-325 MG per tablet   Oral   Take 1 tablet by mouth every 4 (four) hours as needed.   12 tablet   0   . ibuprofen (ADVIL,MOTRIN) 200 MG tablet   Oral   Take 800 mg by mouth every 6 (six) hours as needed for pain.         Marland Kitchen. PRESCRIPTION MEDICATION   Oral   Take 1 tablet by mouth 2 (two) times daily. Taking family members left over antibiotic         .  traMADol (ULTRAM) 50 MG tablet   Oral   Take by mouth every 6 (six) hours as needed for moderate pain.         Marland Kitchen HYDROcodone-acetaminophen (NORCO/VICODIN) 5-325 MG per tablet   Oral   Take 1-2 tablets by mouth every 6 (six) hours as needed.   30 tablet   0    BP 155/62  Pulse 99  Temp(Src) 98.5 F (36.9 C) (Oral)  Resp 19  Wt 230 lb (104.327 kg)  SpO2 100%  LMP 02/01/2014  Physical Exam  Nursing note and vitals reviewed. Constitutional: She appears well-developed and  well-nourished.  HENT:  Head: Normocephalic.  Right Ear: Tympanic membrane, external ear and ear canal normal.  Left Ear: Tympanic membrane, external ear and ear canal normal.  Nose: Nose normal. Right sinus exhibits no maxillary sinus tenderness and no frontal sinus tenderness. Left sinus exhibits no maxillary sinus tenderness and no frontal sinus tenderness.  Mouth/Throat: Uvula is midline, oropharynx is clear and moist and mucous membranes are normal. No oral lesions. Abnormal dentition. Dental caries present. No uvula swelling or lacerations. No oropharyngeal exudate, posterior oropharyngeal edema, posterior oropharyngeal erythema or tonsillar abscesses.  Teeth 1-8 are surgically absent.Teeth  9 and 10 have significant dental carries. Teeth 11-16 are surgically absent. Teeth 17-21 are surgically absent. Carries of 26 and 27. Teeth 28-31 are surgically absent. No erythema of the gum. No induration. No evidence of gross abscess. No sublingual induration.   Eyes: Conjunctivae are normal. Pupils are equal, round, and reactive to light. Right eye exhibits no discharge. Left eye exhibits no discharge.  Neck: Normal range of motion. Neck supple.  Cardiovascular: Normal rate, regular rhythm and normal heart sounds.   Pulmonary/Chest: Effort normal and breath sounds normal. No respiratory distress. She has no wheezes.  Abdominal: Soft. Bowel sounds are normal. She exhibits no distension. There is no tenderness.  Lymphadenopathy:       Head (right side): No submental, no submandibular, no tonsillar, no preauricular, no posterior auricular and no occipital adenopathy present.       Head (left side): No submental, no submandibular, no tonsillar, no preauricular, no posterior auricular and no occipital adenopathy present.    She has no cervical adenopathy.       Right cervical: No superficial cervical, no deep cervical and no posterior cervical adenopathy present.      Left cervical: No superficial  cervical, no deep cervical and no posterior cervical adenopathy present.  Neurological: She is alert.  Skin: Skin is warm and dry.  Psychiatric: She has a normal mood and affect.    ED Course  Procedures (including critical care time) DIAGNOSTIC STUDIES: Oxygen Saturation is 100% on RA, normal by my interpretation.    COORDINATION OF CARE: 5:17 PM Discussed treatment plan with pt at bedside and pt agreed to plan. Will discharge home with Vicodin.     Labs Review Labs Reviewed - No data to display Imaging Review No results found.  EKG Interpretation   None       MDM   Final diagnoses:  Pain due to dental caries    Cathy Skinner presents with dental pain.  Pt [facility] the [facility] for [facility] and treatment.  She filled her PCN rx from them, but reports the pain has become unbearable.  No gross abscess.  Exam unconcerning for Ludwig's angina or spread of infection.  Will treat with penicillin and pain medicine.  Urged patient to follow-up with dentist.  It has been determined that no acute conditions requiring further emergency intervention are present at this time. The patient/guardian have been advised of the diagnosis and plan. We have discussed signs and symptoms that warrant return to the ED, such as changes or worsening in symptoms.   Vital signs are stable at discharge.   BP 155/62  Pulse 99  Temp(Src) 98.5 F (36.9 C) (Oral)  Resp 19  Wt 230 lb (104.327 kg)  SpO2 100%  LMP 02/01/2014  Patient/guardian has voiced understanding and agreed to follow-up with the PCP or specialist.    I personally performed the services described in this documentation, which was scribed in my presence. The recorded information has been reviewed and is accurate.    Dahlia Client Aamir Mclinden, PA-C 02/08/14 1725

## 2014-02-08 NOTE — Discharge Instructions (Signed)
1. Medications: vicoden, usual home medications 2. Treatment: rest, drink plenty of fluids,  3. Follow Up: Please followup with your primary doctor for discussion of your diagnoses and further evaluation after today's visit; if you do not have a primary care doctor use the resource guide provided to find one;    Dental Caries  Dental caries (also called tooth decay) is the most common oral disease. It can occur at any age, but is more common in children and young adults.  HOW DENTAL CARIES DEVELOPS  The process of decay begins when bacteria and foods (particularly sugars and starches) combine in your mouth to produce plaque. Plaque is a substance that sticks to the hard, outer surface of a tooth (enamel). The bacteria in plaque produce acids that attack enamel. These acids may also attack the root surface of a tooth (cementum) if it is exposed. Repeated attacks dissolve these surfaces and create holes in the tooth (cavities). If left untreated, the acids destroy the other layers of the tooth.  RISK FACTORS  Frequent sipping of sugary beverages.   Frequent snacking on sugary and starchy foods, especially those that easily get stuck in the teeth.   Poor oral hygiene.   Dry mouth.   Substance abuse such as methamphetamine abuse.   Broken or poor-fitting dental restorations.   Eating disorders.   Gastroesophageal reflux disease (GERD).   Certain radiation treatments to the head and neck. SYMPTOMS In the early stages of dental caries, symptoms are seldom present. Sometimes white, chalky areas may be seen on the enamel or other tooth layers. In later stages, symptoms may include:  Pits and holes on the enamel.  Toothache after sweet, hot, or cold foods or drinks are consumed.  Pain around the tooth.  Swelling around the tooth. DIAGNOSIS  Most of the time, dental caries is detected during a regular dental checkup. A diagnosis is made after a thorough medical and dental history  is taken and the surfaces of your teeth are checked for signs of dental caries. Sometimes special instruments, such as lasers, are used to check for dental caries. Dental X-ray exams may be taken so that areas not visible to the eye (such as between the contact areas of the teeth) can be checked for cavities.  TREATMENT  If dental caries is in its early stages, it may be reversed with a fluoride treatment or an application of a remineralizing agent at the dental office. Thorough brushing and flossing at home is needed to aid these treatments. If it is in its later stages, treatment depends on the location and extent of tooth destruction:   If a small area of the tooth has been destroyed, the destroyed area will be removed and cavities will be filled with a material such as gold, silver amalgam, or composite resin.   If a large area of the tooth has been destroyed, the destroyed area will be removed and a cap (crown) will be fitted over the remaining tooth structure.   If the center part of the tooth (pulp) is affected, a procedure called a root canal will be needed before a filling or crown can be placed.   If most of the tooth has been destroyed, the tooth may need to be pulled (extracted). HOME CARE INSTRUCTIONS You can prevent, stop, or reverse dental caries at home by practicing good oral hygiene. Good oral hygiene includes:  Thoroughly cleaning your teeth at least twice a day with a toothbrush and dental floss.   Using  a fluoride toothpaste. A fluoride mouth rinse may also be used if recommended by your dentist or health care provider.   Restricting the amount of sugary and starchy foods and sugary liquids you consume.   Avoiding frequent snacking on these foods and sipping of these liquids.   Keeping regular visits with a dentist for checkups and cleanings. PREVENTION   Practice good oral hygiene.  Consider a dental sealant. A dental sealant is a coating material that is  applied by your dentist to the pits and grooves of teeth. The sealant prevents food from being trapped in them. It may protect the teeth for several years.  Ask about fluoride supplements if you live in a community without fluorinated water or with water that has a low fluoride content. Use fluoride supplements as directed by your dentist or health care provider.  Allow fluoride varnish applications to teeth if directed by your dentist or health care provider. Document Released: 09/06/2002 Document Revised: 08/17/2013 Document Reviewed: 12/17/2012 [facility] Patient Information 2014 [place], [place].

## 2014-02-09 NOTE — ED Provider Notes (Signed)
Medical screening examination/treatment/procedure(s) were performed by non-physician practitioner and as supervising physician I was immediately available for consultation/collaboration.  EKG Interpretation   None         Shelda JakesScott W. Declan Mier, MD 02/09/14 1640

## 2014-02-15 ENCOUNTER — Emergency Department: Payer: Self-pay | Admitting: Emergency Medicine

## 2014-04-12 ENCOUNTER — Emergency Department (HOSPITAL_COMMUNITY)
Admission: EM | Admit: 2014-04-12 | Discharge: 2014-04-12 | Discharge status: Against Medical Advice | Payer: Self-pay | Attending: Emergency Medicine | Admitting: Emergency Medicine

## 2014-04-12 ENCOUNTER — Encounter (HOSPITAL_COMMUNITY): Payer: Self-pay | Admitting: Emergency Medicine

## 2014-04-12 DIAGNOSIS — K089 Disorder of teeth and supporting structures, unspecified: Secondary | ICD-10-CM | POA: Insufficient documentation

## 2014-04-12 DIAGNOSIS — F17200 Nicotine dependence, unspecified, uncomplicated: Secondary | ICD-10-CM | POA: Insufficient documentation

## 2014-04-12 DIAGNOSIS — G8929 Other chronic pain: Secondary | ICD-10-CM | POA: Insufficient documentation

## 2014-04-12 NOTE — ED Notes (Signed)
Called X 1- No answer 

## 2014-04-12 NOTE — ED Notes (Signed)
The pt is c/o a toothache since yesterday

## 2014-04-12 NOTE — ED Notes (Signed)
No answer x1

## 2014-04-12 NOTE — ED Notes (Addendum)
Error charting

## 2014-07-18 ENCOUNTER — Encounter (HOSPITAL_COMMUNITY): Payer: Self-pay | Admitting: Emergency Medicine

## 2014-07-18 ENCOUNTER — Emergency Department (HOSPITAL_COMMUNITY)
Admission: EM | Admit: 2014-07-18 | Discharge: 2014-07-18 | Disposition: A | Discharge status: Home / Self Care | Payer: Self-pay | Attending: Emergency Medicine | Admitting: Emergency Medicine

## 2014-07-18 DIAGNOSIS — K0889 Other specified disorders of teeth and supporting structures: Secondary | ICD-10-CM

## 2014-07-18 DIAGNOSIS — Z79899 Other long term (current) drug therapy: Secondary | ICD-10-CM | POA: Insufficient documentation

## 2014-07-18 DIAGNOSIS — F17200 Nicotine dependence, unspecified, uncomplicated: Secondary | ICD-10-CM | POA: Insufficient documentation

## 2014-07-18 DIAGNOSIS — K089 Disorder of teeth and supporting structures, unspecified: Secondary | ICD-10-CM | POA: Insufficient documentation

## 2014-07-18 DIAGNOSIS — G8929 Other chronic pain: Secondary | ICD-10-CM | POA: Insufficient documentation

## 2014-07-18 MED ORDER — IBUPROFEN 800 MG PO TABS: 800.0000 mg | ORAL_TABLET | Freq: Three times a day (TID) | ORAL | Status: AC

## 2014-07-18 MED ORDER — HYDROCODONE-ACETAMINOPHEN 5-325 MG PO TABS: 1.0000 | ORAL_TABLET | ORAL | Status: AC | PRN

## 2014-07-18 NOTE — ED Notes (Signed)
Patient states severely decayed front tooth that needs to be pulled.   Patient claims has been having teeth pulled a little at a time, in hopes of dentures soon.  Patient states started amoxicillin on Sunday from a previous prescription - refill.   Patient states has been taking ibuprofen for pain along with anbesol.

## 2014-07-18 NOTE — Discharge Instructions (Signed)
Call a dentist for further evaluation of your dental pain. °Call for a follow up appointment with a Family or Primary Care Provider.  °Return if Symptoms worsen.   °Take medication as prescribed.  ° ° °You have a dental injury. Use the resource guide listed below to help you find a dentist if you do not already have one to followup with. It is very important that you get evaluated by a dentist as soon as possible. Call tomorrow to schedule an appointment. Use your pain medication as prescribed and do not operate heavy machinery while on pain medication. Note that your pain medication contains acetaminophen (Tylenol) & its is not reccommended that you use additional acetaminophen (Tylenol) while taking this medication. Take your full course of antibiotics. Read the instructions below. ° °Eat a soft or liquid diet and rinse your mouth out after meals with warm water. You should see a dentist or return here at once if you have increased swelling, increased pain or uncontrolled bleeding from the site of your injury. ° ° °SEEK MEDICAL CARE IF:  °· You have increased pain not controlled with medicines.  °· You have swelling around your tooth, in your face or neck.  °· You have bleeding which starts, continues, or gets worse.  °· You have a fever >101 °· If you are unable to open your mouth ° °RESOURCE GUIDE ° °Dental Problems ° °Patients with Medicaid: °[facility]                     [facility] °[address]                                           [address] °Phone:  [phone]                                                  Phone:  [phone] ° °If unable to pay or uninsured, contact:  [facility] or [facility] to become qualified for the adult dental clinic. ° °Chronic Pain Problems °Contact [facility]  297-2271 °Patients need to be referred by their primary care doctor. ° °Insufficient Money for Medicine °Contact [employer]:  call "211" or  [facility] 271-5999. ° °No Primary Care Doctor °Call [facility]  832-8000 °Other agencies that provide inexpensive medical care °   [facility]  832-8035 °   [facility]  832-7272 °   [facility]  271-5999 °   [facility]  832-4777 °   [facility]  373-0678 °   [facility]  272-1050 ° °Psychological Services °[facility]  832-9600 °[facility]  378-7881 °[facility]   800 853-5163 (emergency services 641-4993) ° °Substance Abuse Resources °[facility]  336-882-2125 °[facility] 336-784-9470 °[facility] 336-285-9073 °[facility] 336-845-3988 °Residential & Outpatient Substance Abuse Program  800-659-3381 ° °Abuse/Neglect °[place] [facility] (336) 641-3795 °[place] Child Abuse Hotline 800-378-5315 (After Hours) ° °Emergency Shelter °[facility] (336) 271-5985 ° °Maternity Homes °[facility] (336) 275-9566 °[facility] (704) 372-4663 ° °MRSA Hotline #:     540-9811(360) 481-6044    [facility]  [facility]     [employer]                          [facility] [address] [place]                       [address]      [address]  [place]                                                [place]                            [place] Phone:  [phone]                                   Phone:  [phone]                 Phone:  ([phone]) [phone]  [facility] Phone:  ([phone]  [facility] Hot[facility] 606-290-2140(336) 989-523-3991 [phone])[phone](336) [phone] (After Hours)

## 2014-07-18 NOTE — ED Provider Notes (Signed)
CSN: [id number]     Arrival date & time 07/18/14  1354 History  This chart was scribed for non-physician practitioner Mellody Drown working with No att. providers found by Carl Best, ED Scribe. This patient was seen in room TR06C/TR06C and the patient's care was started at 2:08 PM.    No chief complaint on file.  HPI Comments: Cathy Thompson is a 48 y.o. female who presents to the Emergency Department complaining of constant dental pain located in the front of her mouth that started two days ago.  The patient denies fever and chills as associated symptoms.  She states that she has taken Ibuprofen and Amoxicillin and applied Orajel to the area with no relief to her pain.  She states that she has 10 days left of Amoxicillin that she had refilled from a previous dental procedure.  The patient states that she has had an oral abscess in the past.  She states that she has had her teeth pulled at [facility] dental school and will be going back for more teeth extractions the first Wednesday of August.  She denies being allergic to any medications.  The patient denies having a PCP.  The patient states that she smokes E cigarettes.      The history is provided by the patient. No language interpreter was used.    Past Medical History  Diagnosis Date  . Back pain, chronic   . Dental caries    Past Surgical History  Procedure Laterality Date  . Cholecystectomy    . Back surgery     No family history on file. History  Substance Use Topics  . Smoking status: Current Every Day Smoker -- 0.50 packs/day    Types: Cigarettes  . Smokeless tobacco: Not on file  . Alcohol Use: No   OB History   Grav Para Term Preterm Abortions TAB SAB Ect Mult Living                 Review of Systems  Constitutional: Negative for fever and chills.  HENT: Positive for dental problem.   Skin: Negative for color change.  All other systems reviewed and are negative.     Allergies  Review of patient's allergies  indicates no known allergies.  Home Medications   Prior to Admission medications   Medication Sig Start Date End Date Taking? Authorizing Provider  ibuprofen (ADVIL,MOTRIN) 200 MG tablet Take 800 mg by mouth every 6 (six) hours as needed for pain.    Historical Provider, MD  PRESCRIPTION MEDICATION Take 1 tablet by mouth 2 (two) times daily. Taking family members left over antibiotic 02/07/14   Historical Provider, MD   There were no vitals taken for this visit. Physical Exam  Nursing note and vitals reviewed. Constitutional: She is oriented to person, place, and time. She appears well-developed and well-nourished.  Non-toxic appearance. She does not have a sickly appearance. She does not appear ill. No distress.  HENT:  Head: Normocephalic and atraumatic.  Mouth/Throat: No oropharyngeal exudate.  Tenderness to palpation of tooth # 9. Severe gumline erosion.  No signs of peritonsillar or tonsillar abscess. No signs of gingival abscess. Oropharynx is clear and without exudates. Soft non-tender sublingual mucosa, no tongue elevation, no edema to sublingual space, normal voice. Airway patent.  Eyes: EOM are normal.  Neck: Normal range of motion.  Pulmonary/Chest: No respiratory distress.  Musculoskeletal: Normal range of motion.  Neurological: She is alert and oriented to person, place, and time.  Skin: Skin is warm  and dry. She is not diaphoretic.  Psychiatric: She has a normal mood and affect. Her behavior is normal.    ED Course  Procedures (including critical care time) Labs Review Labs Reviewed - No data to display  Imaging Review No results found.   EKG Interpretation None      MDM   Final diagnoses:  Pain, dental   Patient with toothache.  No gross abscess.  Exam unconcerning for Ludwig's angina or spread of infection.  Will treat with penicillin and pain medicine.  Urged patient to follow-up with dentist.  Pt reports amoxicillin from previous dental visits. Meds  given in ED:  Medications - No data to display  New Prescriptions   HYDROCODONE-ACETAMINOPHEN (NORCO/VICODIN) 5-325 MG PER TABLET    Take 1 tablet by mouth every 4 (four) hours as needed for moderate pain or severe pain.   IBUPROFEN (ADVIL,MOTRIN) 800 MG TABLET    Take 1 tablet (800 mg total) by mouth 3 (three) times daily with meals.    I personally performed the services described in this documentation, which was scribed in my presence. The recorded information has been reviewed and is accurate.    Clabe SealLauren M Charnelle Bergeman, PA-C 07/18/14 1504

## 2014-07-18 NOTE — Discharge Planning (Signed)
[facility] Community Liaison  Spoke to patient regarding follow up care and primary care resources. Resource guide and contact information provided for any future questions or concerns. No other Community Liaison needs identified at this time.

## 2014-07-18 NOTE — ED Provider Notes (Signed)
Medical screening examination/treatment/procedure(s) were performed by non-physician practitioner and as supervising physician I was immediately available for consultation/collaboration.   EKG Interpretation None      Devoria AlbeIva Tadarius Maland, MD, Armando GangFACEP   Ward GivensIva L Zachari Alberta, MD 07/18/14 [phone]

## 2014-09-09 ENCOUNTER — Encounter (HOSPITAL_COMMUNITY): Payer: Self-pay | Admitting: Emergency Medicine

## 2014-09-09 ENCOUNTER — Emergency Department (HOSPITAL_COMMUNITY)
Admission: EM | Admit: 2014-09-09 | Discharge: 2014-09-09 | Disposition: A | Discharge status: Home / Self Care | Payer: Self-pay | Attending: Emergency Medicine | Admitting: Emergency Medicine

## 2014-09-09 DIAGNOSIS — G8929 Other chronic pain: Secondary | ICD-10-CM | POA: Insufficient documentation

## 2014-09-09 DIAGNOSIS — Z792 Long term (current) use of antibiotics: Secondary | ICD-10-CM | POA: Insufficient documentation

## 2014-09-09 DIAGNOSIS — F17200 Nicotine dependence, unspecified, uncomplicated: Secondary | ICD-10-CM | POA: Insufficient documentation

## 2014-09-09 DIAGNOSIS — K089 Disorder of teeth and supporting structures, unspecified: Secondary | ICD-10-CM | POA: Insufficient documentation

## 2014-09-09 DIAGNOSIS — K0889 Other specified disorders of teeth and supporting structures: Secondary | ICD-10-CM

## 2014-09-09 DIAGNOSIS — K029 Dental caries, unspecified: Secondary | ICD-10-CM | POA: Insufficient documentation

## 2014-09-09 DIAGNOSIS — Z791 Long term (current) use of non-steroidal anti-inflammatories (NSAID): Secondary | ICD-10-CM | POA: Insufficient documentation

## 2014-09-09 MED ORDER — OXYCODONE-ACETAMINOPHEN 5-325 MG PO TABS
1.0000 | ORAL_TABLET | Freq: Once | ORAL | Status: AC
  Administered 2014-09-09: 1 via ORAL
  Filled 2014-09-09: qty 1

## 2014-09-09 MED ORDER — OXYCODONE-ACETAMINOPHEN 5-325 MG PO TABS: 1.0000 | ORAL_TABLET | ORAL | Status: AC | PRN

## 2014-09-09 MED ORDER — PENICILLIN V POTASSIUM 500 MG PO TABS: 500.0000 mg | ORAL_TABLET | Freq: Four times a day (QID) | ORAL | Status: AC

## 2014-09-09 NOTE — ED Provider Notes (Signed)
CSN: [id number]     Arrival date & time 09/09/14  1412 History  This chart was scribed for non-physician practitioner, Junious Silk, PA-C working with Mirian Mo, MD, by Jarvis Morgan, ED Scribe. This patient was seen in room TR05C/TR05C and the patient's care was started at 4:25 PM.   Chief Complaint  Patient presents with  . Dental Pain      The history is provided by the patient. No language interpreter was used.   HPI Comments: Cathy Thompson is a 48 y.o. female who presents to the Emergency Department complaining of "sharp", "10/10", severe, upper frontal toothache and right lower tooth ache since yesterday. Pt admits she is trying to get dentures through [facility]. Pt also reports she has been working with the [employer] [facility] to have her teeth pulled that she needs pulled. She admits she is having associated sensitivity to hot and cold. She states she has taken Ibuprofen, Orajel and Amoxicillin with no relief. Nothing seems to relieve the pain. She denies any nausea, emesis, fever, chills, facial swelling or trouble swallowing.   Past Medical History  Diagnosis Date  . Back pain, chronic   . Dental caries    Past Surgical History  Procedure Laterality Date  . Cholecystectomy    . Back surgery     No family history on file. History  Substance Use Topics  . Smoking status: Current Every Day Smoker -- 0.50 packs/day    Types: Cigarettes  . Smokeless tobacco: Not on file  . Alcohol Use: No   OB History   Grav Para Term Preterm Abortions TAB SAB Ect Mult Living                 Review of Systems  Constitutional: Negative for fever and chills.  HENT: Positive for dental problem. Negative for facial swelling and trouble swallowing.   Gastrointestinal: Negative for nausea and vomiting.  All other systems reviewed and are negative.     Allergies  Review of patient's allergies indicates no known allergies.  Home Medications   Prior to Admission  medications   Medication Sig Start Date End Date Taking? Authorizing Provider  amoxicillin (AMOXIL) 500 MG capsule Take 500 mg by mouth 2 (two) times daily.    Historical Provider, MD  HYDROcodone-acetaminophen (NORCO/VICODIN) 5-325 MG per tablet Take 1 tablet by mouth every 4 (four) hours as needed for moderate pain or severe pain. 07/18/14   Mellody Drown, PA-C  ibuprofen (ADVIL,MOTRIN) 200 MG tablet Take 600 mg by mouth every 6 (six) hours as needed for pain.     Historical Provider, MD  ibuprofen (ADVIL,MOTRIN) 800 MG tablet Take 1 tablet (800 mg total) by mouth 3 (three) times daily with meals. 07/18/14   Mellody Drown, PA-C   Triage Vitals: BP 146/65  Pulse 91  Temp(Src) 98 F (36.7 C) (Oral)  Resp 24  Ht  (1.626 m)  Wt 230 lb (104.327 kg)  BMI 39.46 kg/m2  SpO2 95%  LMP 09/09/2014  Physical Exam  Nursing note and vitals reviewed. Constitutional: She is oriented to person, place, and time. She appears well-developed and well-nourished. No distress.  Pt is tearful  HENT:  Head: Normocephalic and atraumatic.  Right Ear: Tympanic membrane, external ear and ear canal normal.  Left Ear: Tympanic membrane, external ear and ear canal normal.  Nose: Nose normal. Right sinus exhibits no maxillary sinus tenderness and no frontal sinus tenderness. Left sinus exhibits no maxillary sinus tenderness and no frontal  sinus tenderness.  Mouth/Throat: Uvula is midline, oropharynx is clear and moist and mucous membranes are normal. No oral lesions. No trismus in the jaw. Abnormal dentition. Dental caries present. No uvula swelling or lacerations. No oropharyngeal exudate, posterior oropharyngeal edema, posterior oropharyngeal erythema or tonsillar abscesses.  Generally poor dentition. No trismus, submental edema or tongue elevation.   Eyes: Conjunctivae are normal. Pupils are equal, round, and reactive to light. Right eye exhibits no discharge. Left eye exhibits no discharge.  Neck: Normal range  of motion. Neck supple.  Cardiovascular: Normal rate, regular rhythm and normal heart sounds.   Pulmonary/Chest: Effort normal and breath sounds normal. No stridor. No respiratory distress. She has no wheezes. She has no rales.  Abdominal: Soft. Bowel sounds are normal. She exhibits no distension. There is no tenderness.  Musculoskeletal: Normal range of motion.  Lymphadenopathy:    She has no cervical adenopathy.  Neurological: She is alert and oriented to person, place, and time. She has normal strength.  Skin: Skin is warm and dry. She is not diaphoretic. No erythema.  Psychiatric: She has a normal mood and affect. Her behavior is normal.    ED Course  Procedures (including critical care time)  DIAGNOSTIC STUDIES: Oxygen Saturation is 95% on RA, adequate by my interpretation.    COORDINATION OF CARE: 4:33 PM-- Will discharge pt with Percocet and Veetid. Pt advised of plan for treatment and pt agrees.   Labs Review Labs Reviewed - No data to display  Imaging Review No results found.   EKG Interpretation None      MDM   Final diagnoses:  Pain, dental   Patient with toothache.  No gross abscess.  Exam unconcerning for Ludwig's angina or spread of infection.  Will treat with penicillin and pain medicine.  Urged patient to follow-up with dentist.    I personally performed the services described in this documentation, which was scribed in my presence. The recorded information has been reviewed and is accurate.    Mora Bellman, PA-C 09/09/14 [id number]

## 2014-09-09 NOTE — Discharge Instructions (Signed)
Dental Pain °A tooth ache may be caused by cavities (tooth decay). Cavities expose the nerve of the tooth to air and hot or cold temperatures. It may come from an infection or abscess (also called a boil or furuncle) around your tooth. It is also often caused by dental caries (tooth decay). This causes the pain you are having. °DIAGNOSIS  °Your caregiver can diagnose this problem by exam. °TREATMENT  °· If caused by an infection, it may be treated with medications which kill germs (antibiotics) and pain medications as prescribed by your caregiver. Take medications as directed. °· Only take over-the-counter or prescription medicines for pain, discomfort, or fever as directed by your caregiver. °· Whether the tooth ache today is caused by infection or dental disease, you should see your dentist as soon as possible for further care. °SEEK MEDICAL CARE IF: °The exam and treatment you received today has been provided on an emergency basis only. This is not a substitute for complete medical or dental care. If your problem worsens or new problems (symptoms) appear, and you are unable to meet with your dentist, call or return to this location. °SEEK IMMEDIATE MEDICAL CARE IF:  °· You have a fever. °· You develop redness and swelling of your face, jaw, or neck. °· You are unable to open your mouth. °· You have severe pain uncontrolled by pain medicine. °MAKE SURE YOU:  °· Understand these instructions. °· Will watch your condition. °· Will get help right away if you are not doing well or get worse. °Document Released: 12/15/2005 Document Revised: 03/08/2012 Document Reviewed: 08/02/2008 °ExitCare® Patient Information ©2015 [employer]. This information is not intended to replace advice given to you by your health care provider. Make sure you discuss any questions you have with your health care provider. °Return to the emergency room for fever, change in vision, redness to the face that rapidly spreads towards the eye,  nausea or vomiting, difficulty swallowing or shortness of breath. ° °You may follow with the dentist listed above. Alternatively,  you may also contact David and Janna Civils who run a low-cost dental clinic at their phone number is [phone]. You may also call [phone] ° °Dental Assistance °If the dentist on-call cannot see you, please use the resources below: ° ° °Patients with Medicaid: [facility] °[address], [phone] °[address], [phone] ° °If unable to pay, or uninsured, contact [facility] ([phone]) or [facility] ([phone] in [place], [phone] in [place]) to become qualified for the adult dental clinic ° °Other Low-Cost Community Dental Services: °[facility]- [address], [place], [place], [address] °   [phone], Ext. [phone] °   2nd and 4th Thursday of the month at 6:30am °   10 clients each day by appointment, can sometimes see walk-in     patients if someone does not show for an appointment °[facility]- [address], [place], [place], [address] °   [phone] °[facility]- [address], [place], [place], [address] °   [phone] ° °[facility]- [phone] °[facility]- [phone] °[facility]- [phone] ° °

## 2014-09-09 NOTE — ED Notes (Signed)
Pt c/o upper frontal toothache and right lower toothache. 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 Pt has tried Ibuprofen without relief.

## 2014-09-10 NOTE — ED Provider Notes (Signed)
Medical screening examination/treatment/procedure(s) were performed by non-physician practitioner and as supervising physician I was immediately available for consultation/collaboration.   EKG Interpretation None        Matthew Gentry, MD 09/10/14 1542 

## 2014-10-07 ENCOUNTER — Emergency Department: Payer: Self-pay | Admitting: Emergency Medicine

## 2014-10-21 IMAGING — CR DG LUMBAR SPINE 2-3V
1 series · 3 of 3 positions shown · non-contrast
Comparison: None.

CLINICAL DATA: Pain post trauma

EXAM:
LUMBAR SPINE - 2-3 VIEW

[Series 1: t lumbar spine ap · 0.14mm/px · 3 of 3 slices shown]
[im 1/3]
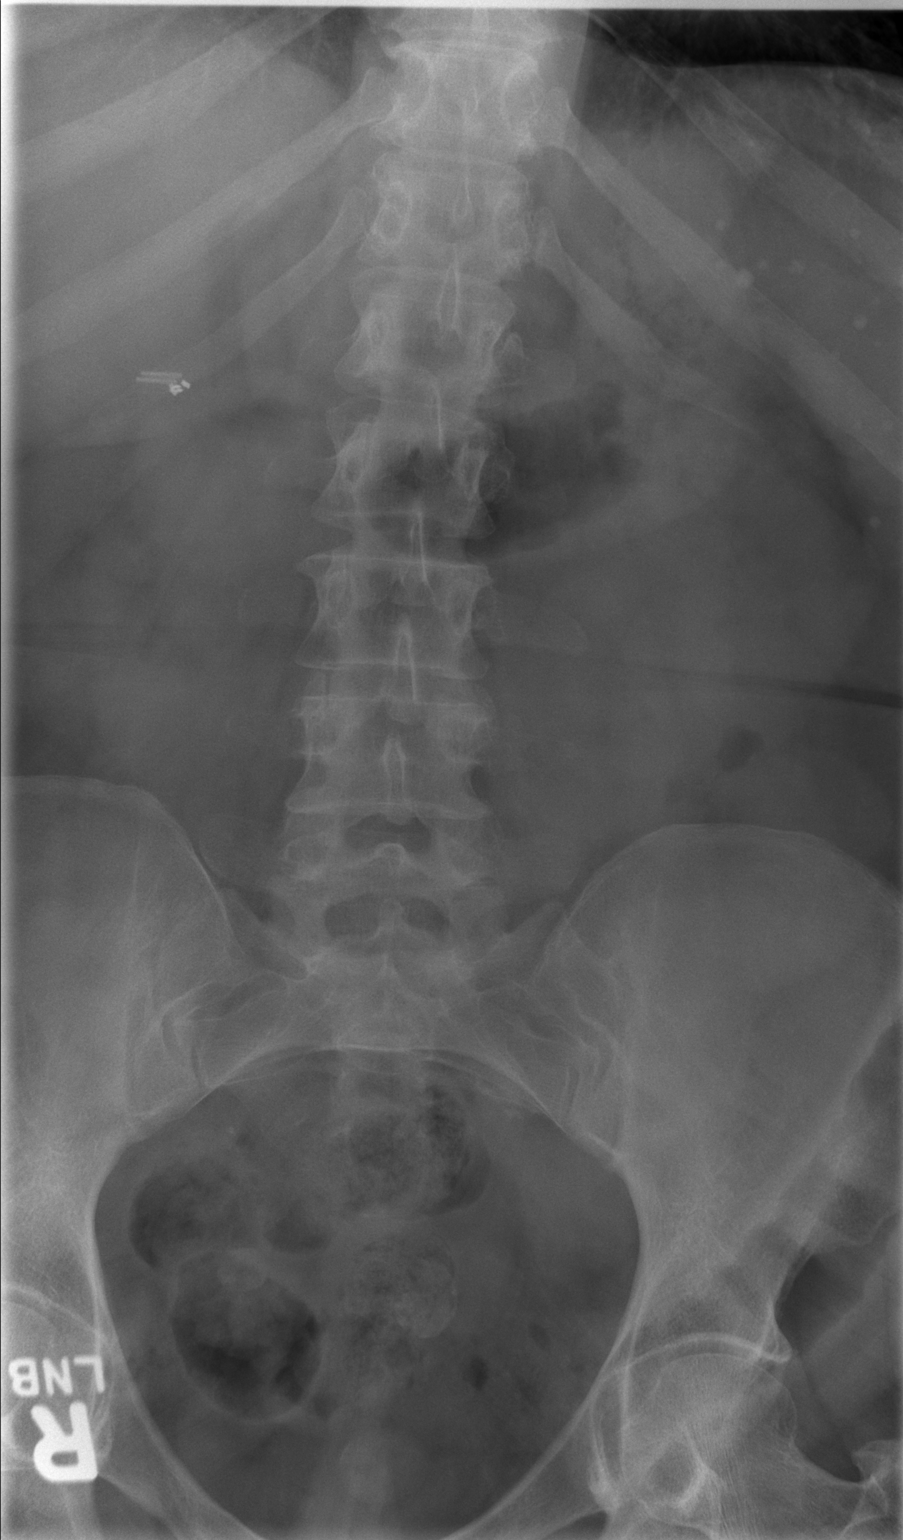
[im 2/3]
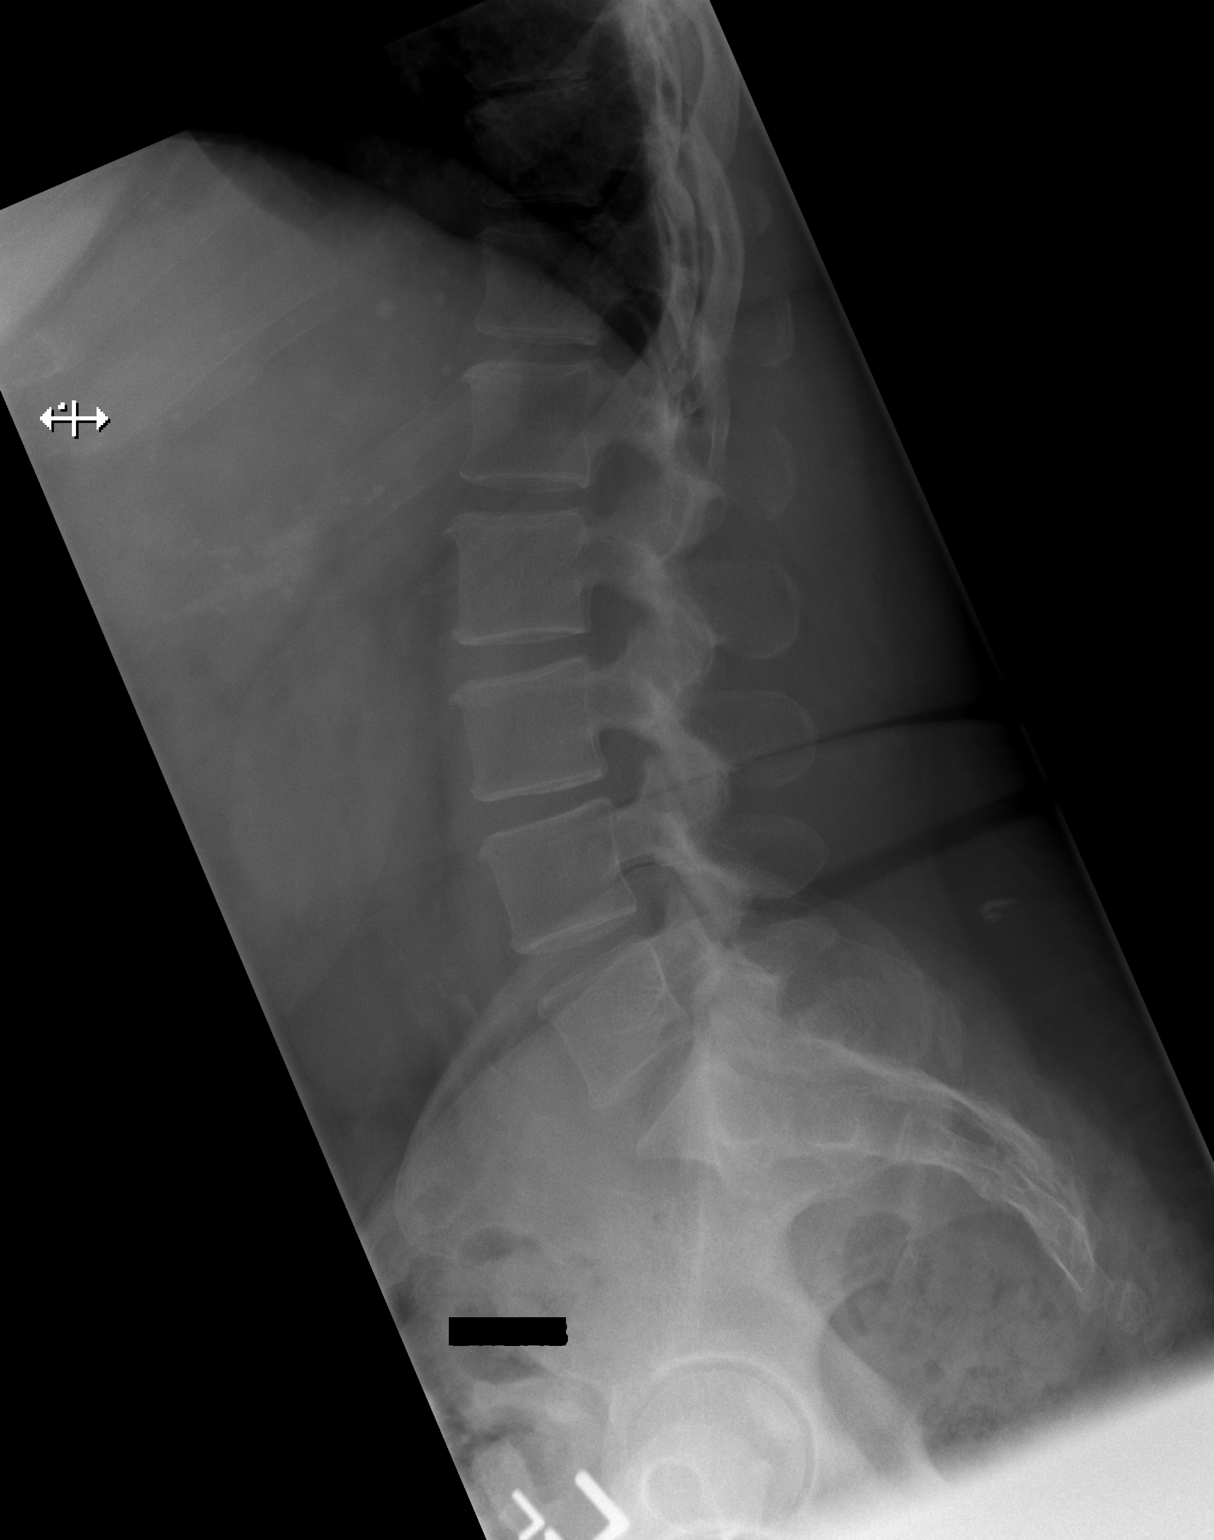
[im 3/3]
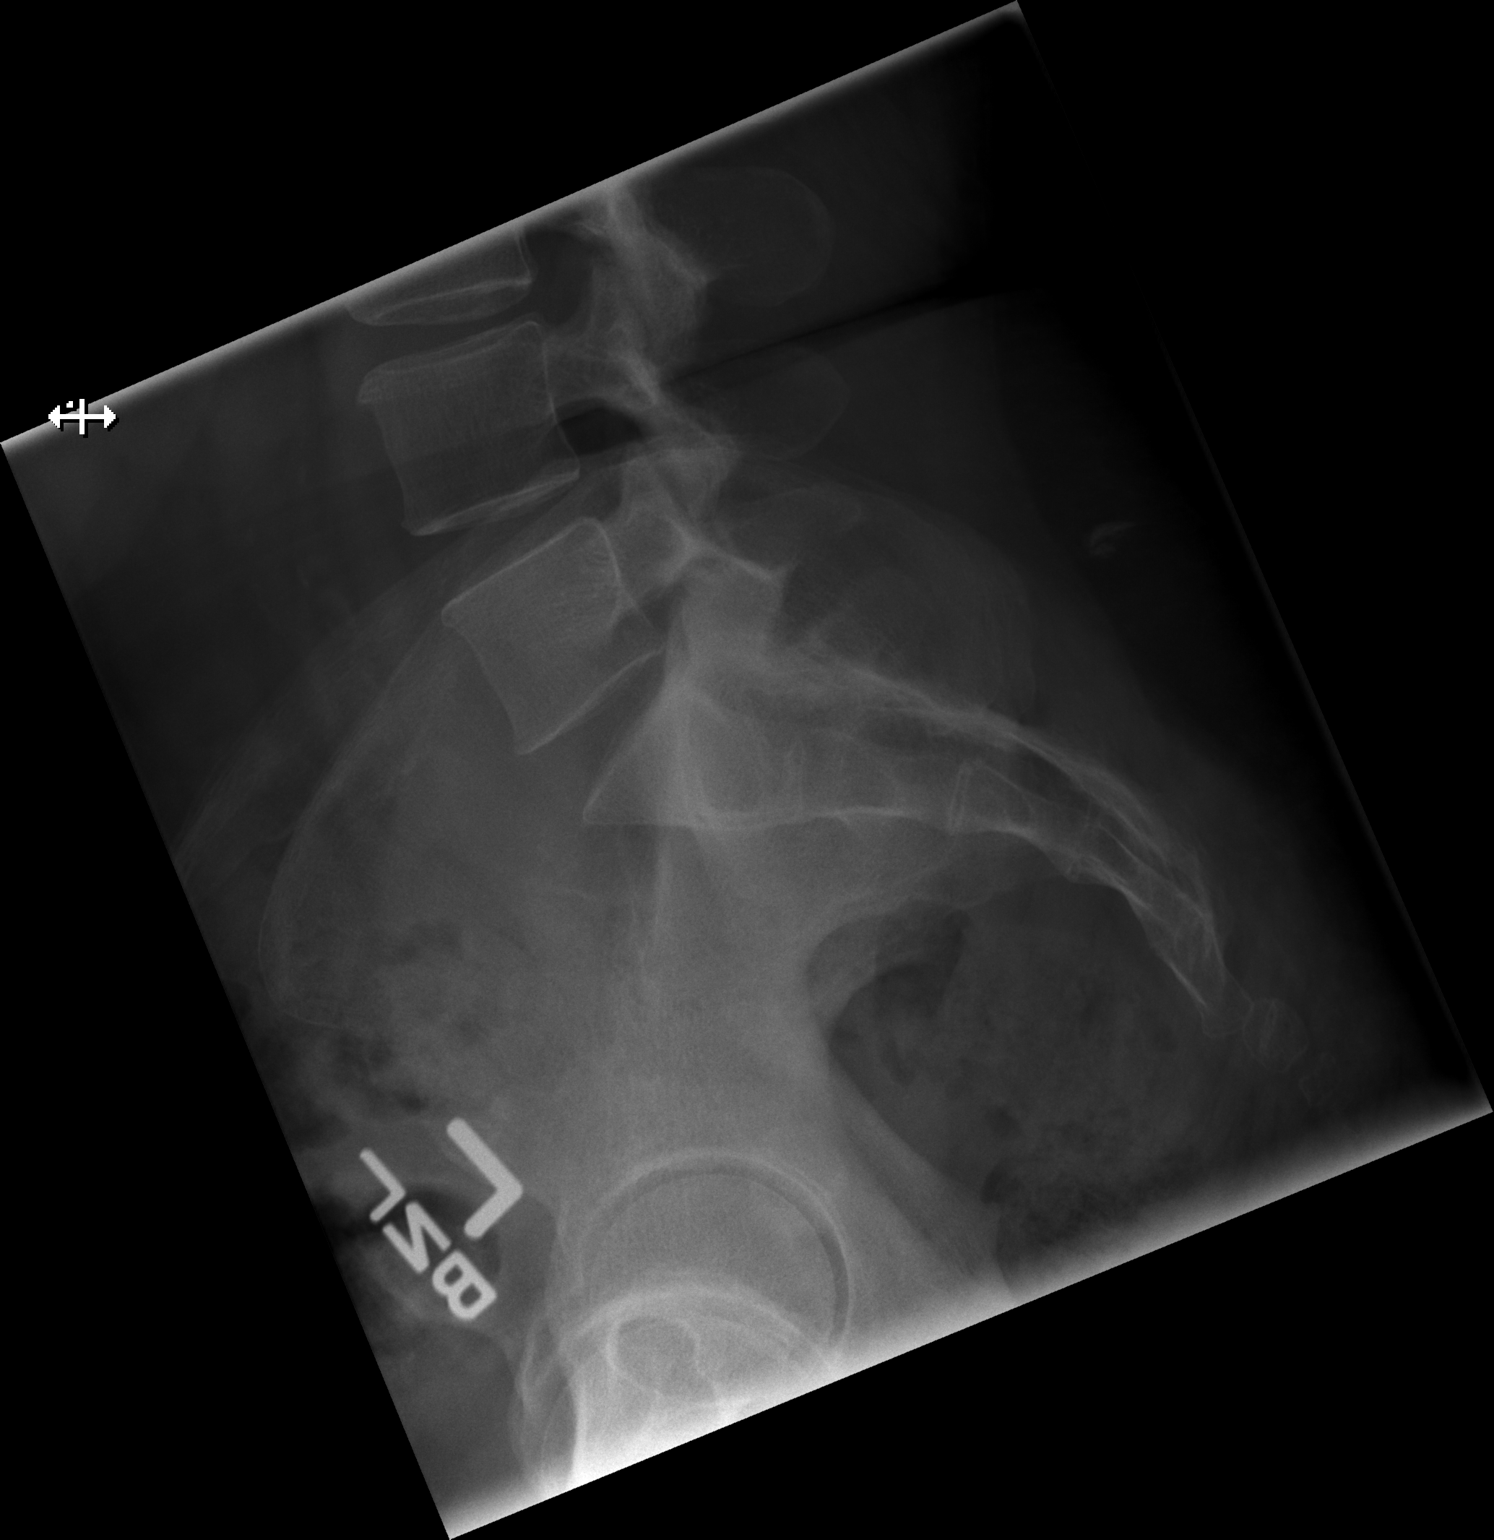

[3 of 3 positions shown; findings below may reference images not displayed]

FINDINGS: Frontal, lateral, and spot lumbosacral lateral images were obtained.
There are 5 non-rib-bearing lumbar type vertebral bodies. There is
no fracture or spondylolisthesis. Disc spaces appear intact. No
erosive change.

There are surgical clips in the gallbladder fossa region. There are
calcifications in the left upper quadrant consistent with splenic
granulomas.
IMPRESSION: No fracture or appreciable arthropathy.  No spondylolisthesis.

## 2014-11-08 ENCOUNTER — Emergency Department: Payer: Self-pay | Admitting: Emergency Medicine

## 2014-12-05 ENCOUNTER — Emergency Department: Payer: Self-pay | Admitting: Emergency Medicine

## 2015-01-29 DEATH — deceased

## 2015-04-20 NOTE — H&P (Signed)
PATIENT NAME:  Cathy Thompson, Cathy Thompson MR#:  [id number] DATE OF BIRTH:  [birth year]  DATE OF ADMISSION:  03/08/2013  PRIMARY CARE PHYSICIAN:  Nonlocal  REFERRING PHYSICIAN:  Dr. Mayford KnifeWilliams CHIEF COMPLAINT:  Cough, shortness of breath, nausea, vomiting for 3 days.   HISTORY OF PRESENT ILLNESS: A 49 year old Caucasian female with past history of bronchitis presented in the ED with above chief complaint. The patient is alert, awake, oriented, in no active distress. The patient said that she got a cold after usual contact with somebody who has a cold and developed cough with clear sputum. In addition, the patient has shortness of breath and nausea and vomiting multiple times for the past 3 days. The patient is not sure whether she has a fever or chills. She denies any chest pain, palpitation. No orthopnea or nocturnal dyspnea. No leg edema. The patient denies any abdominal pain or diarrhea. No melena or bloody stool.  According to Dr.Williams, the patient has hypoxia EED and was treated with oxygen.  In addition, the patient had influenza A and B and was negative. The patient's influenza A was sent for the tests. Chest x-ray showed bilateral pneumonitis. The patient was treated with Tamiflu and Levaquin EED.  PAST MEDICAL HISTORY:  Bronchitis.   PAST SURGICAL HISTORY:  Cholecystectomy and back surgery.   FAMILY HISTORY:  Father had blood cancer.   SOCIAL HISTORY: The patient smokes 1/2 pack a day for 30 years and recently changed to electronic tobacco for the past 6 months.  Denies any alcohol drinking or illicit drugs.   MEDICATIONS:  No.  NO REVIEW OF SYSTEMS:  CONSTITUTIONAL:  The patient denies any fever or chills. No headache or dizziness. No weakness.   EYES:  No double motivation.  ENT:  No postnasal drip, slurred speech or dysphagia.  CARDIOVASCULAR:  No chest pain, palpitation, orthopnea or nocturnal dyspnea. No leg edema.  PULMONARY:  Positive for cough, sputum, shortness of breath, but no  hemoptysis.  GASTROINTESTINAL:  No abdominal pain or diarrhea but has nausea, vomiting and no melena or bloody stool.  GENITOURINARY:  No dysuria, hematuria or incontinence.  SKIN:  No rash or jaundice.  HEMATOLOGY:  No easy bleeding or bleeding.  NEUROLOGY:  No syncope, loss of consciousness or seizure.  ENDOCRINE:  No polyuria, polydipsia, heat or cold intolerance.   VITAL SIGNS: Temperature 98.2, blood pressure 111/71, pulse 82, respirations 18, oxygen saturation 97% on room air.   PHYSICAL EXAMINATION: GENERAL:  The patient is alert, awake, oriented, in no acute distress.  HEENT:  Pupils round, equal, reactive to light and accommodation. NECK:  Supple.  No JVD or carotid bruit. No lymphadenopathy. No thyromegaly.  Moist oral mucosa.  Clear oropharynx. CARDIOVASCULAR:  S1, S2 regular rate, rhythm. No murmurs or gallops.  PULMONARY: Bilateral air entry. No wheezing or rales. No use of accessory muscles to breathe.  ABDOMEN:  Soft, obese, bowel sounds present. No organomegaly. No distention. No tenderness. EXTREMITIES: No edema, clubbing or cyanosis. No calf tenderness. Strong bilateral pedal pulses.  NEUROLOGY:  AO x 3.  No focal deficit.  Power 5/5. Sensation intact. DTR 2+.   LABORATORY DATA:  Ultrasound of abdomen shows slight infiltrate with hepatomegaly.  Influenza A and B are negative. Urinalysis negative. Urinalysis shows RBC 677, WBC 2.   Chest x-ray shows significant diffuse increased density and is likely interstitial and concerning widespread pneumonitis. WBC 9.4, hemoglobin 13.7, platelets 182, glucose 121, BUN 12, creatinine 0.66, sodium 138, potassium 4.0, chloride 97, bicarbonate 26, lipase  68, INR 1.2.   IMPRESSION:  1.  Pneumonia. 2.  Hypoxia.  3.  Hyponatremia.  4.  Obesity.   PLAN OF TREATMENT: 1. The patient will be admitted to medical floor. We will continue O2 by nasal cannula. Continue Levaquin, Tamiflu and give tessalon for cough.  2.  We will follow up CBC,  blood culture, sputum culture, legionella antigen culture. 3.  For hyponatremia, we will give normal saline and follow up BMP.  4.  For obesity, we will check lipid panel and hemoglobin A1c. 5. Gastrointestinal deep vein thrombosis prophylaxis.  Discussed patient's condition and plan of treatment with the patient.   TIME SPENT:  About 55 minutes    ____________________________ Shaune Pollack, MD qc:ce D: 03/08/2013 16:01:25 ET T: 03/08/2013 16:25:51 ET JOB#: [id number]  cc: Shaune Pollack, MD, <Dictator> Shaune Pollack MD ELECTRONICALLY SIGNED 03/08/2013 22:10

## 2015-04-20 NOTE — Consult Note (Signed)
Impression:    49yo female w/o sig PMHx admitted with H1N1 influenza and new diagnosis of Hep C infection.     Her current illness is likely due to the H1N1 influenza.  I suspect that she has had hep C for quite a while and the LFT abnormalities are due to the acute influenza infection.    She is on Tamiflu.  Would continue a 5 day course.    Will d/c levofloxacin.    I will plan on seeing her in the office in 1-2 weeks and rechecking her LFTs.  At that time, I will check her hep C viral load and genotype.      Her only risk factors for Hep C infection are tattoos from the 1980s and blood transfusion after her back surgery. She denies any recent risk factors which would make this likely to be acute Hep C.  Even were she to have acute hep C, there is no treatment necessary.    Her HIV test was negative.  Electronic Signatures: Blocker MPH, Rosalyn GessMichael E (MD)  (Signed on 14-Mar-14 13:56)  Authored  Last Updated: 14-Mar-14 13:56 by Blocker MPH, Rosalyn GessMichael E (MD)

## 2015-04-20 NOTE — Consult Note (Signed)
PATIENT NAME:  Cathy Thompson, Cathy Thompson MR#:  [id number] DATE OF BIRTH:  September 19, 1966  DATE OF CONSULTATION:  03/11/2013  REFERRING PHYSICIAN:  Dr. Posey Pronto CONSULTING PHYSICIAN:  Heinz Knuckles. Blocker, MD  REASON FOR CONSULTATION: Fevers, chills, sweats and cough.   HISTORY OF PRESENT ILLNESS: The patient is a 49 year old female without significant past medical history who was admitted on 03/11 with a 2 to 3 day history of nausea, vomiting, malaise, fevers, chills, sweats, cough and shortness of breath. She states that she had been in her usual state of health until she developed the above symptoms and became very ill. She had had an exposure to someone at work who was coughing and had some nausea, but that person has completely recovered. She was exposed quite a while ago to a child who had H1N1 influenza and died. She had had exposure to him approximately a week before his death, but this was several weeks prior. She came to the Emergency Room and was found to be hypoxic.  Chest x-ray showed bilateral infiltrates. She was admitted and started on Levaquin and Tamiflu. Rapid influenza testing was negative. A H1N1 PCR was positive. She is feeling somewhat better today. Of note, she had abnormal LFTs on admission and hepatitis serologies have demonstrated total hepatitis A antibody positive with IgM negative. Hepatitis B serologies are negative and hepatitis C serologies are positive. She states that she has tattoos that were placed in the 1980s. She received a blood transfusion after her back surgery several years ago, but has no history of injecting drug use. Her LFTs have come down somewhat.   ALLERGIES: No known drug allergies.   PAST MEDICAL HISTORY:   1.  Status post back surgery.  2.  Status post cholecystectomy.  3.  Eczema.   FAMILY HISTORY: Positive for leukemia in her father and breast cancer on her mother's side. There is also coronary artery disease on her mother's side.   SOCIAL HISTORY: The patient  quit smoking about 6 months ago. Prior to that she was smoking 1/2 pack of cigarettes per day. She does not drink. No injecting drug use history. She has a dog at home.  REVIEW OF SYSTEMS:   GENERAL: Positive fevers, chills, sweats, malaise and fatigue.  HEENT: Occasional headaches due to coughing. No sinus congestion. No sore throat.  NECK: No stiffness. No swollen glands.  RESPIRATORY: Positive cough without productive sputum. Positive shortness of breath.  CARDIAC: Chest pains with coughing. No peripheral edema. No palpitations.  GASTROENTEROLOGY: Positive nausea and vomiting. No abdominal pain or change in her bowels.  GENITOURINARY: No change in her urine.  MUSCULOSKELETAL: She has generalized myalgias and arthralgias, but no frank arthritis.  SKIN: No rashes.  NEUROLOGIC: No focal weakness.  PSYCHIATRIC: No complaints. All other systems were negative.   PHYSICAL EXAMINATION: VITAL SIGNS: T-max 98.8, T-current 98.0, pulse 64, blood pressure 113/79, 95% on 2 liters.  GENERAL: A 49 year old white female, obese, and in no acute distress. HEENT: Normocephalic, atraumatic. Pupils equal and reactive to light. Extraocular motion intact. Sclerae, conjunctivae and lids are without evidence for emboli or petechiae. Oropharynx shows no erythema or exudate. Teeth and gums are in fair condition.  NECK: Supple. Full range of motion. Midline trachea. No lymphadenopathy. No thyromegaly.  LUNGS: Clear to auscultation bilaterally with good air movement. No focal consolidation.  HEART: Regular rate and rhythm without murmur, rub or gallop.  ABDOMEN: Soft, nontender and nondistended. No hepatosplenomegaly. No hernia is noted. She is obese.  EXTREMITIES: No evidence  for tenosynovitis.  SKIN: No rashes. No stigmata of endocarditis, specifically no Janeway lesions or Osler nodes.  NEUROLOGIC: The patient is awake and interactive, moving all 4 extremities.  PSYCHIATRIC: Mood and affect appeared normal.    LABORATORY DATA: BUN 10, creatinine 0.66, bicarbonate 34, anion gap 1. LFTs, on 03/09/2013, showed an AST of 379, ALT 638, total bilirubin 0.5 and alk phos 106. On admission her AST was 920 with an ALT of 870. White count is 5.0 with a hemoglobin of 11.6, platelet count of 189 and ANC of 3.4. White count on admission was 9.4. Rapid influenza testing was negative. Blood cultures from admission are negative. A UA had 3+ blood, 30 mg/dL of protein with negative nitrites, negative leukocyte esterase, 677 red cells and 2 white cells per high-power field. H1N1 influenza PCR was positive. Hepatitis serologies showed positive hepatitis A total antibody with a negative IgM. Hepatitis B core antibody IgM and total were negative. Hepatitis C surface antibody was negative. Hepatitis C antibody was positive.  Legionella antigen was negative. HIV antibodies were negative.   DIAGNOSTICS:  Chest x-ray from admission showed significant diffuse interstitial and alveolar pneumonitis. Abdominal ultrasound showed hepatomegaly with fatty infiltration. Repeat chest x-ray showed persistent interstitial and alveolar densities diffusely. Chest x-ray from yesterday, following placement of a PICC line, shows no complications   IMPRESSION: A 49 year old female without significant past medical history who is admitted with H1N1 influenza and a new diagnosis of hepatitis C infection.   RECOMMENDATIONS: 1. Her current illness is likely due to the H1N1 influenza. I suspect that she has had hepatitis C for quite a while and the LFT abnormalities are more likely due to the acute influenza infection.  2.  She is on Tamiflu. Would continue a 5 day course.  3.  Will discontinue levofloxacin.  4.  I will plan on seeing her in the office in 1 to 2 weeks and rechecking her LFTs. At that time, I will check her hepatitis C viral load and a genotype. We will make decisions about treatment at that time. We will repeat her LFTs tomorrow.  5.  Her  only risk factor for hepatitis C infection are tattoos from the 1980s and a blood transfusion after her back surgery, which was more recently. She denies any risk factors which would make this likely to be acute hepatitis C infection. If she were to have acute hepatitis C, there is no treatment necessary.  6.  Her HIV test was negative.   This is a moderately complex infectious disease consult. Thank you very much for involving me in Ms. Fouch care. ____________________________ Heinz Knuckles. Blocker, MD meb:sb D: 03/11/2013 14:05:18 ET T: 03/11/2013 14:31:52 ET JOB#: [id number]  cc: Heinz Knuckles. Blocker, MD, <Dictator> MICHAEL E BLOCKER MD ELECTRONICALLY SIGNED 03/14/2013 9:39

## 2015-04-20 NOTE — Discharge Summary (Signed)
PATIENT NAME:  Cathy Thompson, Cathy Thompson MR#:  [id number] DATE OF BIRTH:  1966/08/24  DATE OF ADMISSION:  03/08/2013 DATE OF DISCHARGE:  03/12/2013  ADMITTING DIAGNOSIS: Cough, shortness of breath, nausea and vomiting for the past 3 days.   DISCHARGE DIAGNOSES:  1.  Cough and shortness of breath due to acute influenza with possible pneumonia, treated with Tamiflu, symptoms improved.  2.  Acute respiratory failure possibly related to pneumonia, influenza, respiratory status is now much improved. Also, the patient may have had volume overload. Echocardiogram shows normal ejection fraction.  3.  Hyponatremia.  4.  Positive hepatitis C,  needs outpatient followup with Dr. Leavy CellaBlocker.  5.  Morbid obesity.  6.  Nicotine addiction.  7.  Acute on chronic hepatitis, worsening liver function tests, could have been related to flu. Underlying elevated liver function tests like due to chronic hepatitis C.  PERTINENT LABORATORIES AND EVALUATIONS: The patient's hepatitis C was positive. INR 1.2. Lipase 68. BMP: Glucose 121, BUN 12, creatinine 0.66, sodium 130, potassium 4.0, chloride 97, CO2 26, calcium 7.7. LFTs: ALT 870, AST 920, total protein 8.2, albumin of 8.2. HIV was negative. WBC count 9.4, hemoglobin 13.7, platelet count 182. Chest x-ray showed diffuse bilateral interstitial infiltrates, possibly due to pneumonia. Urinalysis showed RBCs of 677, 3+ blood. Influenza A and B was negative; however, H1, N1 influenza PCR was positive. Right upper quadrant ultrasound showed hepatomegaly with increase in hepatic echo texture suggestive of fatty infiltration. Blood cultures x 2 no growth. Legionella urine antigen was negative. Fasting lipid panel: Total cholesterol 109, triglycerides 95, HDL 32, LDL 58. Hemoglobin A1c was 5.8. Repeat LFTs on 03/12 showed ALT of 638, AST 379. Echocardiogram of the heart showed moderate aortic valve sclerosis without stenosis, moderately increased left ventricular posterior wall thickness and  ejection fraction of 50% to 55%. LFTs on 03/15, ALT 442, AST 110.   HOSPITAL COURSE: Please refer to H and P done by the admitting physician. The patient is a 49 year old obese female with history of smoking, who presented with complaint of cough and shortness of breath. The patient was not sure if he had fevers or chills. She was seen in the ED and noted to be hypoxic. Chest x-ray showed bilateral infiltrate. She had initial influenza A and B swab negative; however, the PCR for H1, N1 was positive. She was treated with Tamiflu. Also, for possible pneumonia, she was treated with IV antibiotics. Her blood work also showed she had hepatitis C positive. ID consult was obtained. They agreed with Tamiflu. They felt that her elevated LFTs were likely related to acute influenza on chronic hepatitis C. The patient also was thought to have possible pneumonia. She was treated with Levaquin. The patient also received IV fluid and she received Lasix with good urine output. It was not clear whether she had volume overload or not. She did have an echo, which showed a normal preserved EF. At this time, she is not febrile. She is on room,  air doing much better and is requesting to be discharged home. At this time, she is stable for discharge.    MEDICATIONS:  Tylenol 650 q. 4 hours p.r.n. for temperature greater than 100.4, Tamiflu 75 mg 1 tab p.o. q. 12 for 2 more days, guaifenesin 600 mg 1 tab p.o. b.i.d., Avelox 400 mg 1 tab p.o. daily x 4 days, Tussionex 5 mL q. 12 hours p.r.n. cough.   DIET: Regular.   ACTIVITY: As tolerated.   FOLLOWUP: With primary MD in 2 to  4 weeks. Follow up with Dr. Leavy Cella in 2 weeks.Marland Kitchen   TIME SPENT: 35 minutes.    ____________________________ Lacie Scotts Allena Katz, MD shp:aw D: 03/12/2013 13:21:24 ET T: 03/13/2013 06:53:27 ET JOB#: [id number]  cc: Florence Yeung H. Allena Katz, MD, <Dictator> Charise Carwin MD ELECTRONICALLY SIGNED 03/14/2013 8:08
